# Patient Record
Sex: Male | Born: 1940 | Race: White | Hispanic: No | Marital: Married | Smoking: Never smoker
Health system: Southern US, Community
[De-identification: ages and names within clinical notes are randomized; demographics above are authoritative.]

## PROBLEM LIST (undated history)

## (undated) DIAGNOSIS — E7800 Pure hypercholesterolemia, unspecified: Secondary | ICD-10-CM

## (undated) DIAGNOSIS — R079 Chest pain, unspecified: Secondary | ICD-10-CM

## (undated) DIAGNOSIS — D126 Benign neoplasm of colon, unspecified: Secondary | ICD-10-CM

## (undated) DIAGNOSIS — J984 Other disorders of lung: Secondary | ICD-10-CM

## (undated) DIAGNOSIS — J45909 Unspecified asthma, uncomplicated: Secondary | ICD-10-CM

## (undated) DIAGNOSIS — K5730 Diverticulosis of large intestine without perforation or abscess without bleeding: Secondary | ICD-10-CM

## (undated) DIAGNOSIS — C61 Malignant neoplasm of prostate: Secondary | ICD-10-CM

## (undated) DIAGNOSIS — F411 Generalized anxiety disorder: Secondary | ICD-10-CM

## (undated) DIAGNOSIS — J329 Chronic sinusitis, unspecified: Secondary | ICD-10-CM

## (undated) DIAGNOSIS — M545 Low back pain, unspecified: Secondary | ICD-10-CM

## (undated) DIAGNOSIS — K449 Diaphragmatic hernia without obstruction or gangrene: Secondary | ICD-10-CM

## (undated) DIAGNOSIS — I059 Rheumatic mitral valve disease, unspecified: Secondary | ICD-10-CM

## (undated) DIAGNOSIS — IMO0002 Reserved for concepts with insufficient information to code with codable children: Secondary | ICD-10-CM

## (undated) DIAGNOSIS — M25519 Pain in unspecified shoulder: Secondary | ICD-10-CM

## (undated) DIAGNOSIS — I10 Essential (primary) hypertension: Secondary | ICD-10-CM

## (undated) DIAGNOSIS — M542 Cervicalgia: Secondary | ICD-10-CM

## (undated) DIAGNOSIS — Z8719 Personal history of other diseases of the digestive system: Secondary | ICD-10-CM

## (undated) DIAGNOSIS — J309 Allergic rhinitis, unspecified: Secondary | ICD-10-CM

## (undated) DIAGNOSIS — J209 Acute bronchitis, unspecified: Secondary | ICD-10-CM

## (undated) DIAGNOSIS — K219 Gastro-esophageal reflux disease without esophagitis: Secondary | ICD-10-CM

## (undated) HISTORY — DX: Chest pain, unspecified: R07.9

## (undated) HISTORY — DX: Reserved for concepts with insufficient information to code with codable children: IMO0002

## (undated) HISTORY — DX: Other disorders of lung: J98.4

## (undated) HISTORY — DX: Chronic sinusitis, unspecified: J32.9

## (undated) HISTORY — DX: Unspecified asthma, uncomplicated: J45.909

## (undated) HISTORY — DX: Diaphragmatic hernia without obstruction or gangrene: K44.9

## (undated) HISTORY — DX: Pure hypercholesterolemia, unspecified: E78.00

## (undated) HISTORY — DX: Gastro-esophageal reflux disease without esophagitis: K21.9

## (undated) HISTORY — DX: Cervicalgia: M54.2

## (undated) HISTORY — DX: Low back pain: M54.5

## (undated) HISTORY — DX: Acute bronchitis, unspecified: J20.9

## (undated) HISTORY — DX: Allergic rhinitis, unspecified: J30.9

## (undated) HISTORY — DX: Generalized anxiety disorder: F41.1

## (undated) HISTORY — PX: ACHILLES TENDON REPAIR: SUR1153

## (undated) HISTORY — DX: Benign neoplasm of colon, unspecified: D12.6

## (undated) HISTORY — DX: Diverticulosis of large intestine without perforation or abscess without bleeding: K57.30

## (undated) HISTORY — DX: Pain in unspecified shoulder: M25.519

## (undated) HISTORY — DX: Personal history of other diseases of the digestive system: Z87.19

## (undated) HISTORY — PX: LUMBAR LAMINECTOMY: SHX95

## (undated) HISTORY — DX: Rheumatic mitral valve disease, unspecified: I05.9

## (undated) HISTORY — PX: TONSILLECTOMY: SUR1361

## (undated) HISTORY — DX: Malignant neoplasm of prostate: C61

## (undated) HISTORY — PX: OTHER SURGICAL HISTORY: SHX169

## (undated) HISTORY — DX: Low back pain, unspecified: M54.50

---

## 2001-10-18 ENCOUNTER — Encounter: Admission: RE | Admit: 2001-10-18 | Discharge: 2001-10-18 | Payer: Self-pay | Admitting: Otolaryngology

## 2001-10-18 ENCOUNTER — Encounter: Payer: Self-pay | Admitting: Otolaryngology

## 2004-08-05 ENCOUNTER — Ambulatory Visit: Payer: Self-pay | Admitting: Pulmonary Disease

## 2004-09-29 ENCOUNTER — Ambulatory Visit: Payer: Self-pay | Admitting: Pulmonary Disease

## 2005-10-15 ENCOUNTER — Ambulatory Visit: Payer: Self-pay | Admitting: Pulmonary Disease

## 2005-10-30 ENCOUNTER — Ambulatory Visit: Payer: Self-pay | Admitting: Gastroenterology

## 2005-11-09 ENCOUNTER — Encounter (INDEPENDENT_AMBULATORY_CARE_PROVIDER_SITE_OTHER): Payer: Self-pay | Admitting: Specialist

## 2005-11-09 ENCOUNTER — Ambulatory Visit: Payer: Self-pay | Admitting: Gastroenterology

## 2005-11-12 ENCOUNTER — Ambulatory Visit: Payer: Self-pay | Admitting: Pulmonary Disease

## 2005-12-22 ENCOUNTER — Ambulatory Visit: Payer: Self-pay | Admitting: Gastroenterology

## 2005-12-22 ENCOUNTER — Ambulatory Visit: Payer: Self-pay | Admitting: Pulmonary Disease

## 2006-08-04 ENCOUNTER — Ambulatory Visit: Payer: Self-pay | Admitting: Pulmonary Disease

## 2006-08-04 LAB — CONVERTED CEMR LAB
ALT: 17 units/L (ref 0–40)
AST: 21 units/L (ref 0–37)
Albumin: 3.8 g/dL (ref 3.5–5.2)
Alkaline Phosphatase: 73 units/L (ref 39–117)
BUN: 14 mg/dL (ref 6–23)
Basophils Absolute: 0 10*3/uL (ref 0.0–0.1)
Basophils Relative: 0.4 % (ref 0.0–1.0)
Bilirubin Urine: NEGATIVE
CO2: 29 meq/L (ref 19–32)
Calcium: 9.1 mg/dL (ref 8.4–10.5)
Chloride: 108 meq/L (ref 96–112)
Chol/HDL Ratio, serum: 7.1
Cholesterol: 232 mg/dL (ref 0–200)
Creatinine, Ser: 1 mg/dL (ref 0.4–1.5)
Crystals: NEGATIVE
Eosinophil percent: 3 % (ref 0.0–5.0)
GFR calc non Af Amer: 80 mL/min
Glomerular Filtration Rate, Af Am: 97 mL/min/{1.73_m2}
Glucose, Bld: 105 mg/dL — ABNORMAL HIGH (ref 70–99)
HCT: 42.4 % (ref 39.0–52.0)
HDL: 32.6 mg/dL — ABNORMAL LOW (ref >39.0)
Hemoglobin: 13.8 g/dL (ref 13.0–17.0)
Ketones, ur: NEGATIVE mg/dL
LDL DIRECT: 164.5 mg/dL
Leukocytes, UA: NEGATIVE
Lymphocytes Relative: 24 % (ref 12.0–46.0)
MCHC: 32.5 g/dL (ref 30.0–36.0)
MCV: 90.1 fL (ref 78.0–100.0)
Monocytes Absolute: 0.6 10*3/uL (ref 0.2–0.7)
Monocytes Relative: 9.6 % (ref 3.0–11.0)
Mucus, UA: NEGATIVE
Neutro Abs: 3.9 10*3/uL (ref 1.4–7.7)
Neutrophils Relative %: 63 % (ref 43.0–77.0)
Nitrite: NEGATIVE
PSA: 4.87 ng/mL — ABNORMAL HIGH (ref 0.10–4.00)
Platelets: 201 10*3/uL (ref 150–400)
Potassium: 4.1 meq/L (ref 3.5–5.1)
RBC: 4.71 M/uL (ref 4.22–5.81)
RDW: 13.5 % (ref 11.5–14.6)
Sodium: 141 meq/L (ref 135–145)
Specific Gravity, Urine: >=1.03 (ref 1.000–1.03)
TSH: 1.08 microintl units/mL (ref 0.35–5.50)
Total Bilirubin: 1.2 mg/dL (ref 0.3–1.2)
Total Protein, Urine: NEGATIVE mg/dL
Total Protein: 6.6 g/dL (ref 6.0–8.3)
Triglyceride fasting, serum: 102 mg/dL (ref 0–149)
Urine Glucose: NEGATIVE mg/dL
Urobilinogen, UA: 0.2 (ref 0.0–1.0)
VLDL: 20 mg/dL (ref 0–40)
WBC: 6.2 10*3/uL (ref 4.5–10.5)
pH: 6 (ref 5.0–8.0)

## 2006-11-10 ENCOUNTER — Inpatient Hospital Stay (HOSPITAL_COMMUNITY): Admission: RE | Admit: 2006-11-10 | Discharge: 2006-11-11 | Payer: Self-pay | Admitting: Urology

## 2006-11-10 ENCOUNTER — Encounter (INDEPENDENT_AMBULATORY_CARE_PROVIDER_SITE_OTHER): Payer: Self-pay | Admitting: Specialist

## 2007-02-22 ENCOUNTER — Ambulatory Visit: Payer: Self-pay | Admitting: Pulmonary Disease

## 2007-02-22 LAB — CONVERTED CEMR LAB
ALT: 14 units/L (ref 0–53)
AST: 15 units/L (ref 0–37)
Bilirubin, Direct: 0.1 mg/dL (ref 0.0–0.3)
Cholesterol: 193 mg/dL (ref 0–200)
HDL: 30 mg/dL — ABNORMAL LOW (ref >39.0)
Total Bilirubin: 0.9 mg/dL (ref 0.3–1.2)
Triglycerides: 73 mg/dL (ref 0–149)

## 2007-10-07 ENCOUNTER — Encounter: Payer: Self-pay | Admitting: Pulmonary Disease

## 2008-02-17 ENCOUNTER — Encounter: Payer: Self-pay | Admitting: Pulmonary Disease

## 2008-02-29 ENCOUNTER — Ambulatory Visit: Admission: RE | Admit: 2008-02-29 | Discharge: 2008-04-17 | Payer: Self-pay | Admitting: Radiation Oncology

## 2008-03-01 ENCOUNTER — Encounter: Payer: Self-pay | Admitting: Pulmonary Disease

## 2008-05-01 ENCOUNTER — Encounter: Payer: Self-pay | Admitting: Pulmonary Disease

## 2008-05-18 ENCOUNTER — Encounter: Payer: Self-pay | Admitting: Pulmonary Disease

## 2008-05-28 DIAGNOSIS — K219 Gastro-esophageal reflux disease without esophagitis: Secondary | ICD-10-CM | POA: Insufficient documentation

## 2008-05-28 DIAGNOSIS — J309 Allergic rhinitis, unspecified: Secondary | ICD-10-CM | POA: Insufficient documentation

## 2008-05-28 DIAGNOSIS — E7800 Pure hypercholesterolemia, unspecified: Secondary | ICD-10-CM

## 2008-06-05 ENCOUNTER — Telehealth: Payer: Self-pay | Admitting: Gastroenterology

## 2008-06-11 DIAGNOSIS — K5730 Diverticulosis of large intestine without perforation or abscess without bleeding: Secondary | ICD-10-CM | POA: Insufficient documentation

## 2008-06-11 DIAGNOSIS — K449 Diaphragmatic hernia without obstruction or gangrene: Secondary | ICD-10-CM | POA: Insufficient documentation

## 2008-06-11 DIAGNOSIS — F411 Generalized anxiety disorder: Secondary | ICD-10-CM | POA: Insufficient documentation

## 2008-06-11 DIAGNOSIS — J45909 Unspecified asthma, uncomplicated: Secondary | ICD-10-CM | POA: Insufficient documentation

## 2008-06-12 ENCOUNTER — Ambulatory Visit: Payer: Self-pay | Admitting: Gastroenterology

## 2008-06-13 ENCOUNTER — Encounter: Payer: Self-pay | Admitting: Gastroenterology

## 2008-06-18 ENCOUNTER — Ambulatory Visit: Payer: Self-pay | Admitting: Internal Medicine

## 2008-08-24 ENCOUNTER — Encounter: Payer: Self-pay | Admitting: Pulmonary Disease

## 2008-09-29 ENCOUNTER — Emergency Department (HOSPITAL_COMMUNITY): Admission: EM | Admit: 2008-09-29 | Discharge: 2008-09-30 | Payer: Self-pay | Admitting: Emergency Medicine

## 2008-10-01 ENCOUNTER — Telehealth: Payer: Self-pay | Admitting: Pulmonary Disease

## 2008-10-04 ENCOUNTER — Encounter (INDEPENDENT_AMBULATORY_CARE_PROVIDER_SITE_OTHER): Payer: Self-pay | Admitting: *Deleted

## 2008-10-04 ENCOUNTER — Ambulatory Visit: Payer: Self-pay | Admitting: Pulmonary Disease

## 2008-10-04 DIAGNOSIS — J984 Other disorders of lung: Secondary | ICD-10-CM | POA: Insufficient documentation

## 2008-10-04 DIAGNOSIS — C61 Malignant neoplasm of prostate: Secondary | ICD-10-CM

## 2008-10-04 DIAGNOSIS — M545 Low back pain: Secondary | ICD-10-CM

## 2008-10-04 DIAGNOSIS — I059 Rheumatic mitral valve disease, unspecified: Secondary | ICD-10-CM | POA: Insufficient documentation

## 2008-10-04 DIAGNOSIS — J209 Acute bronchitis, unspecified: Secondary | ICD-10-CM

## 2008-10-04 DIAGNOSIS — M542 Cervicalgia: Secondary | ICD-10-CM | POA: Insufficient documentation

## 2008-10-04 DIAGNOSIS — IMO0002 Reserved for concepts with insufficient information to code with codable children: Secondary | ICD-10-CM | POA: Insufficient documentation

## 2008-10-04 DIAGNOSIS — J329 Chronic sinusitis, unspecified: Secondary | ICD-10-CM | POA: Insufficient documentation

## 2008-10-04 DIAGNOSIS — M25519 Pain in unspecified shoulder: Secondary | ICD-10-CM | POA: Insufficient documentation

## 2008-10-12 ENCOUNTER — Encounter: Payer: Self-pay | Admitting: Pulmonary Disease

## 2008-10-17 ENCOUNTER — Encounter: Payer: Self-pay | Admitting: Pulmonary Disease

## 2008-11-01 ENCOUNTER — Ambulatory Visit: Payer: Self-pay | Admitting: Gastroenterology

## 2008-11-01 DIAGNOSIS — Z860100 Personal history of colon polyps, unspecified: Secondary | ICD-10-CM | POA: Insufficient documentation

## 2008-11-19 ENCOUNTER — Ambulatory Visit: Payer: Self-pay | Admitting: Gastroenterology

## 2008-11-19 ENCOUNTER — Encounter: Payer: Self-pay | Admitting: Gastroenterology

## 2008-11-22 ENCOUNTER — Encounter: Payer: Self-pay | Admitting: Gastroenterology

## 2008-12-25 ENCOUNTER — Encounter: Payer: Self-pay | Admitting: Pulmonary Disease

## 2009-04-02 ENCOUNTER — Encounter: Payer: Self-pay | Admitting: Pulmonary Disease

## 2009-06-25 ENCOUNTER — Encounter: Payer: Self-pay | Admitting: Pulmonary Disease

## 2009-08-01 ENCOUNTER — Encounter: Payer: Self-pay | Admitting: Pulmonary Disease

## 2009-10-17 ENCOUNTER — Ambulatory Visit: Payer: Self-pay | Admitting: Pulmonary Disease

## 2009-10-18 LAB — CONVERTED CEMR LAB
Alkaline Phosphatase: 84 units/L (ref 39–117)
Basophils Absolute: 0.2 10*3/uL — ABNORMAL HIGH (ref 0.0–0.1)
Basophils Relative: 2.4 % (ref 0.0–3.0)
Bilirubin, Direct: 0.1 mg/dL (ref 0.0–0.3)
CO2: 29 meq/L (ref 19–32)
Calcium: 9.1 mg/dL (ref 8.4–10.5)
Creatinine, Ser: 1 mg/dL (ref 0.4–1.5)
Eosinophils Absolute: 0.3 10*3/uL (ref 0.0–0.7)
GFR calc non Af Amer: 78.98 mL/min (ref >60)
Hemoglobin: 13.9 g/dL (ref 13.0–17.0)
Lymphocytes Relative: 15.6 % (ref 12.0–46.0)
MCHC: 33.6 g/dL (ref 30.0–36.0)
Monocytes Relative: 5.8 % (ref 3.0–12.0)
Neutro Abs: 6.3 10*3/uL (ref 1.4–7.7)
Neutrophils Relative %: 73 % (ref 43.0–77.0)
RBC: 4.45 M/uL (ref 4.22–5.81)
RDW: 13.4 % (ref 11.5–14.6)
Sodium: 142 meq/L (ref 135–145)
Total Bilirubin: 0.9 mg/dL (ref 0.3–1.2)

## 2009-11-29 ENCOUNTER — Encounter: Payer: Self-pay | Admitting: Pulmonary Disease

## 2010-04-01 ENCOUNTER — Encounter: Payer: Self-pay | Admitting: Pulmonary Disease

## 2010-04-17 ENCOUNTER — Ambulatory Visit: Payer: Self-pay | Admitting: Pulmonary Disease

## 2010-04-19 LAB — CONVERTED CEMR LAB
ALT: 20 units/L (ref 0–53)
AST: 18 units/L (ref 0–37)
Albumin: 4.1 g/dL (ref 3.5–5.2)
BUN: 21 mg/dL (ref 6–23)
Chloride: 106 meq/L (ref 96–112)
Cholesterol: 235 mg/dL — ABNORMAL HIGH (ref 0–200)
Creatinine, Ser: 1 mg/dL (ref 0.4–1.5)
Eosinophils Relative: 5.8 % — ABNORMAL HIGH (ref 0.0–5.0)
Glucose, Bld: 96 mg/dL (ref 70–99)
HDL: 31.9 mg/dL — ABNORMAL LOW (ref >39.00)
Lymphocytes Relative: 19.5 % (ref 12.0–46.0)
Monocytes Absolute: 0.5 10*3/uL (ref 0.1–1.0)
Monocytes Relative: 10.8 % (ref 3.0–12.0)
Neutrophils Relative %: 63.7 % (ref 43.0–77.0)
Platelets: 186 10*3/uL (ref 150.0–400.0)
Potassium: 4.5 meq/L (ref 3.5–5.1)
RBC: 4.62 M/uL (ref 4.22–5.81)
Total CHOL/HDL Ratio: 7
Total Protein: 6.8 g/dL (ref 6.0–8.3)
Triglycerides: 153 mg/dL — ABNORMAL HIGH (ref 0.0–149.0)
WBC: 4.9 10*3/uL (ref 4.5–10.5)

## 2010-05-07 ENCOUNTER — Telehealth (INDEPENDENT_AMBULATORY_CARE_PROVIDER_SITE_OTHER): Payer: Self-pay | Admitting: *Deleted

## 2010-07-21 ENCOUNTER — Telehealth (INDEPENDENT_AMBULATORY_CARE_PROVIDER_SITE_OTHER): Payer: Self-pay | Admitting: *Deleted

## 2010-07-24 ENCOUNTER — Telehealth (INDEPENDENT_AMBULATORY_CARE_PROVIDER_SITE_OTHER): Payer: Self-pay | Admitting: *Deleted

## 2010-08-06 ENCOUNTER — Telehealth: Payer: Self-pay | Admitting: Pulmonary Disease

## 2010-08-12 ENCOUNTER — Encounter: Payer: Self-pay | Admitting: Pulmonary Disease

## 2010-09-02 NOTE — Letter (Signed)
[facility]   Imported By: Sherian Rein 12/06/2009 09:29:39  _____________________________________________________________________  External Attachment:    Type:   Image     Comment:   External Document

## 2010-09-02 NOTE — Assessment & Plan Note (Signed)
Summary: cpx/fasting/apc   Primary Care Provider:  Alroy Dust, MD  CC:  18 month ROV & review of mult medical problems....  History of Present Illness: 70 y/o WM here for a follow up visit... he has multiple medical problems as noted below...     ~  states he was doing fine until Dec09 w/ onset of left shoulder pain- treated self w/ Advil/ Tylenol/ Icey Hot & improved after 2-3 weeks... recurrence of pain towards the end of Feb10 & went to an EMT friend who did EKG "for precautionary reasons" & it was OK, but BP was 168/102 (BP's at home usually 130-140's/ 70-80's)... he went to Urgent Care "to be safe & get blood work" they did XRay of chest & shoulder w/ ?of widening of mediastinum- sent to ER where BP was 170/110 initially and grad ret to 130/80 range... eval included CTChest/ Abd/ Pelvis= NEG w/ mult incidental findings- right base atelec, 6mm RLL nodule, right adrenal nodule, cysts in kidneys/ liver, DDD in lumbar sp... XRay CSpine= degen changes, bilat foraminal stenoses, facet arthropathy, anterolisthesis C5 & C6... EKG was neg x PVC, and cardiac enz neg... labs were normal... he was told to f/u w/ [place] for "mass in lung" and DrBerry for "further cardiac testing"- no meds given... he saw DrGamble 3/2 w/ labs done & set up for Myoview...   ~  Mar10:  we discussed the above frantic eval & focused on 3 things: 1)  Neck & shoulder pain- we will start anti-inflamm pain meds, heat, etc; and refer to Neurosurg; 2)  Cardiac eval- already arranged by DrGamble & Amlodipine 5mg  started... pt will be reassured by these tests; 3)  6mm RLL lung nodule- pt is a non-smoker, hx of asthma, no chest symptoms- f/u CXR planned...   ~  April 17, 2010:  88mo ROV- he saw DrBerry 3/10 w/ 2DEcho (normal, x mild AI) & Myoview (neg- no ischemia, EF= 63%)... he had a colonoscopy by DrPatterson 4/10- mild divertics, sm nodule in sigmid, bx= tubular adenoma & f/u planned 39yrs...  he saw DrGrapey for Urology 8/11 for f/u  prostate cancer s/p prostatectomy & then had adjuvant XRT for rising PSA post surg & is followed every 4-74mo w/ continued slow rise in PSA to 0.8 8/11(watchful waiting protocol before considering hormonal therapy)...  he feels his Asthma is under good control;  CXR today for f/u 6mm nodule-  no change;  BP undr good conrol, denies CP, etc;  Chol is poorly controlled on diet alone- but he is intol to all statins etc;  otherw stable w/o new complaints or concerns he says.   Current Problems:   ALLERGIC RHINITIS (ICD-477.9) & Hx of SINUSITIS, RECURRENT (ICD-473.9) - he uses OTC antihistamines Prn... he has a long hx of allergies w/ pos testing to dust mostly... he had nasal polyp surg in 1985... he is ASPIRIN sensitive ([facility])... he had additional sinus surgery by DrLawrence in 1997...  ~  9/11:  we discussed chr sinus regimen w/ Antihist in AM, Saline nasal pray, & FLONASE Qhs.  ASTHMA (ICD-493.90) & Hx of ASTHMATIC BRONCHITIS, ACUTE (ICD-466.0) - on ADVAIR 500Bid & VENTOLIN Prn w/ good control... prev on Singulair as well but he stopped this on his own.  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  repeat CXR 9/11 showed clear lungs, norm heart size, no nodule seen...  Hx of CHEST PAIN (  ICD-786.50) & MITRAL VALVE PROLAPSE (ICD-424.0) - there is a pos family hx of coronary disease... he had a negative cath in 1992- no coronary disease, min MVP seen... he has a hx of MVP and occas ectopy w/ incr BP response to exercise... given low dose Atenolol in past... 2DEcho and Cardiolite in 2005 were both reported normal by DrBerry...  ~  3/10:  repeat cardiac eval by [facility] w/ 2DEcho norm x mild AI, & neg Myoview- no ischemia, EF=63%... started AMLODIPINE 5mg /d.  HYPERCHOLESTEROLEMIA (ICD-272.0) - eval and management by DrBerry for [facility]... pt reports being tried on numerous Statins and Zetia & states INTOL to all these  meds... in 2008 he stopped Zocor40 due to aching and started Grapeseed extract made by his cousin in [facility] (he reports that this caused bone pain as well)...  ~  FLP here 7/08 showed TChol 193, TG 73, HDL 30, LDL 148... rec- incr Simva20 to 40mg /d (Intol & stopped by pt).  ~  He reports interim FLPs by [facility]...  ~  FLP here 9/11 showed TChol 235, TG 153, HDL 32, LDL 184... needs better diet & exercise program.  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  ~  he had a 24H pH probe in 1991 which was unremarkable...  ~  EGD 4/07 by DrPatterson showed 8cmHH, cameron erosions, chr GERD, & gastritis... HPylori was neg.  DIVERTICULOSIS, COLON (ICD-562.10) & COLONIC POLYPS (ICD-211.3) - he had a lower GI Bleed & was hosp at [facility] in [place] 2/07- required 6u blood, did not require surgery... he takes METAMUCIL daily...  ~  colonoscopy 4/07 by DrPatterson showed divertics, 6mm polyp in asc colon= adenomatous w/ f/u planned for 50yrs...  ~  CT Abd/ Pelvis Feb10= NEG w/ mult incidental findings- right base atelec, 6mm RLL nodule, right adrenal nodule, cysts in kidneys/ liver, DDD in lumbar sp...  ~  colonoscopy 4/10 by DrPatterson showed divertics & nodule in sigmoid= tubular adenoma on bx; f/u planned 73yrs.  Hx of PROSTATE CANCER (ICD-185) - found to have right postate nodule on DRE 1/08 during his last CPX... PSA= 4.87... referred to Urology w/ pos biopies and robotic prostatectomy 4/08 by DrGrapey- he had capsular extension but neg margins etc... subseq PSA's were nondetectable, but then started to rise again and was 0.22 in Jul09- therefore sent for adjuvant XRT which he tolerated well w/ decr in the PSA... pt still followed by DrGrapey every 4-6 months due to continued slow rise in PSA post XRT...  DEGENERATIVE DISC DISEASE (ICD-722.6) - on VOLTAREN 75mg Bid Prn (he apparently tolerates NSAIDs OK despite his hx [facility] Ast[facility] & ASA sensitivity)...  NECK PAIN  (ICD-723.1) & SHOULDER PAIN (ICD-719.41)  ~  XRays Feb10 from ER showed  degen changes, bilat foraminal stenoses, facet arthropathy, anterolisthesis C5 & C6.  Hx of BACK PAIN, LUMBAR (ICD-724.2) - he had 2 lumbar laminectomies in the past by DrDeaton...   ANXIETY (ICD-300.00)   Preventive Screening-Counseling & Management  Alcohol-Tobacco     Smoking Status: never  Allergies: 1)  Asa  Comments:  Nurse/Medical Assistant: The patient's medications and allergies were reviewed with the patient and were updated in the Medication and Allergy Lists.  Past History:  Past Medical History: ALLERGIC RHINITIS (ICD-477.9) Hx of SINUSITIS, RECURRENT (ICD-473.9) ASTHMA (ICD-493.90) Hx of ASTHMATIC BRONCHITIS, ACUTE (ICD-466.0) PULMONARY NODULE (ICD-518.89) Hx of CHEST PAIN (ICD-786.50) MITRAL VALVE PROLAPSE (ICD-424.0) HYPERCHOLESTEROLEMIA (ICD-272.0) HIATAL HERNIA (ICD-553.3) GERD (ICD-530.81) DIVERTICULOSIS, COLON (ICD-562.10) COLONIC POLYPS (ICD-211.3) HX OF  GALLSTONE (ICD-V12.79) Hx of PROSTATE CANCER (ICD-185) DEGENERATIVE DISC DISEASE (ICD-722.6) NECK PAIN (ICD-723.1) SHOULDER PAIN (ICD-719.41) Hx of BACK PAIN, LUMBAR (ICD-724.2) ANXIETY (ICD-300.00)  Past Surgical History: S/P tonsillectomy S/P 2 lumbar laminectomies by DrDeaton S/P 3 sinus operations for polyps & sinusitis by DrLawrence S/P achilles tendon repair S/P robotic prostatectomy by DrGrapey  Family History: Reviewed history from 10/04/2008 and no changes required. Father died age 38 w/ MI Mother died age 31 w/ lung cancer 5 Siblings- 3 Bro- one died w/ MI at age 53, one died age 39 w/ heart disease 2 Sis- one w/ thyroid problems & heart disease  Social History: Reviewed history from 10/17/2009 and no changes required. 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    Review of Systems       The patient complains of nasal congestion,  constipation, gas/bloating, stiffness, arthritis, and hay fever.  The patient denies fever, chills, sweats, anorexia, fatigue, weakness, malaise, weight loss, sleep disorder, blurring, diplopia, eye irritation, eye discharge, vision loss, eye pain, photophobia, earache, ear discharge, tinnitus, decreased hearing, nosebleeds, sore throat, hoarseness, chest pain, palpitations, syncope, dyspnea on exertion, orthopnea, PND, peripheral edema, cough, dyspnea at rest, excessive sputum, hemoptysis, wheezing, pleurisy, nausea, vomiting, diarrhea, change in bowel habits, abdominal pain, melena, hematochezia, jaundice, indigestion/heartburn, dysphagia, odynophagia, dysuria, hematuria, urinary frequency, urinary hesitancy, nocturia, incontinence, back pain, joint pain, joint swelling, muscle cramps, muscle weakness, sciatica, restless legs, leg pain at night, leg pain with exertion, rash, itching, dryness, suspicious lesions, paralysis, paresthesias, seizures, tremors, vertigo, transient blindness, frequent falls, frequent headaches, difficulty walking, depression, anxiety, memory loss, confusion, cold intolerance, heat intolerance, polydipsia, polyphagia, polyuria, unusual weight change, abnormal bruising, bleeding, enlarged lymph nodes, urticaria, allergic rash, and recurrent infections.    Vital Signs:  Patient profile:   70 year old male Height:      70 inches Weight:      197.25 pounds O2 Sat:      96 % on Room air Temp:     97.4 degrees F oral Pulse rate:   42 / minute BP sitting:   130 / 68  (left arm) Cuff size:   large  Vitals Entered By: Randell Loop CMA (April 17, 2010 9:07 AM)  O2 Sat at Rest %:  96 O2 Flow:  Room air CC: 18 month ROV & review of mult medical problems... Is Patient Diabetic? No Pain Assessment Patient in pain? no      Comments meds updated today with pt   Physical Exam  Additional Exam:  WD, WN, 70 y/o WM in NAD...  GENERAL:  Alert & oriented; pleasant &  cooperative... HEENT:  /AT, EOM-wnl, PERRLA, EACs-clear, TMs-wnl, NOSE-congested, THROAT-clear & wnl. NECK:  Supple w/ decrROM; no JVD; normal carotid impulses w/o bruits; no thyromegaly or nodules palpated; no lymphadenopathy. CHEST:  Clear to P & A;  min scat rhonchi without wheezing/ rales/ consolidation... HEART:  Regular Rhythm; without murmurs/ rubs/ or gallops detected... ABDOMEN:  Soft & nontender; normal bowel sounds; no organomegaly or masses palpated... EXT: without deformities, mod arthritic changes; no varicose veins/ venous insuffic/ or edema. BACK:  +tender spots/ trigger points... decr ROM spine... NEURO:  CN's intact;  no focal neuro deficits... DERM:  No lesions noted; no rash etc...    CXR  Procedure date:  04/17/2010  Findings:      CHEST - 2 VIEW Comparison: 08/04/2006   Findings: Heart size is within normal limits.  Both lungs are clear.  No evidence of pleural effusion.  No mass or lymphadenopathy identified.   IMPRESSION: Stable exam.  No active disease.   Read By:  Danae Orleans,  M.D.   MISC. Report  Procedure date:  04/17/2010  Findings:      BMP (METABOL)   Sodium                    141 mEq/L                   135-145   Potassium                 4.5 mEq/L                   3.5-5.1   Chloride                  106 mEq/L                   96-112   Carbon Dioxide            29 mEq/L                    19-32   Glucose                   96 mg/dL                    16-10   BUN                       21 mg/dL                    9-60   Creatinine                1.0 mg/dL                   4.5-4.0   Calcium                   9.1 mg/dL                   9.8-11.9   GFR                       77.96 mL/min                >60  Hepatic/Liver Function Panel (HEPATIC)   Total Bilirubin           1.0 mg/dL                   1.4-7.8   Direct Bilirubin          0.2 mg/dL                   2.9-5.6   Alkaline Phosphatase      75 U/L                      39-117    AST                       18 U/L                      0-37   ALT  20 U/L                      0-53   Total Protein             6.8 g/dL                    1.6-1.0   Albumin                   4.1 g/dL                    9.6-0.4  CBC Platelet w/Diff (CBCD)   White Cell Count          4.9 K/uL                    4.5-10.5   Red Cell Count            4.62 Mil/uL                 4.22-5.81   Hemoglobin                15.1 g/dL                   54.0-98.1   Hematocrit                43.7 %                      39.0-52.0   MCV                       94.6 fl                     78.0-100.0   Platelet Count            186.0 K/uL                  150.0-400.0   Neutrophil %              63.7 %                      43.0-77.0   Lymphocyte %              19.5 %                      12.0-46.0   Monocyte %                10.8 %                      3.0-12.0   Eosinophils%         [H]  5.8 %                       0.0-5.0   Basophils %               0.2 %                       0.0-3.0  Comments:      Lipid Panel (LIPID)   Cholesterol          [H]  235 mg/dL                   1-914   Triglycerides        [  H]  153.0 mg/dL                 6.5-784.6   HDL                  [L]  96.29 mg/dL                 >52.84   Cholesterol LDL - Direct                             184.0 mg/dL       TSH (TSH)   FastTSH                   1.55 uIU/mL                 0.35-5.50  Vitamin D (25-Hydroxy) (13244)  Vitamin D (25-Hydroxy)                             32 ng/mL                    30-89  Impression & Recommendations:  Problem # 1:  ASTHMA (ICD-493.90) He has AR/ Asthma>  continue regular meds & we discussed Zyrtek, Saline, Flonase for nose;  and Advair, Ventolin for Asthma... His updated medication list for this problem includes:    Advair Diskus 500-50 Mcg/dose Misc (Fluticasone-salmeterol) ..... One puff by mouth two times a day   rinse mouth after each use    Ventolin Hfa 108 (90 Base)  Mcg/act Aers (Albuterol sulfate) ..... Inhale 2 puffs every four hours as needed  Problem # 2:  PULMONARY NODULE (ICD-518.89) CXR is clear>  no nodule seen... Orders: T-2 View CXR (71020TC)  Problem # 3:  HYPERCHOLESTEROLEMIA (ICD-272.0) Chol is not well controlled on diet, but no options for med Rx as he is intol to all meds in this category... he is not inclined to try the [facility] Clinic therefore continue diet & exercise efforts at better control... Orders: TLB-BMP (Basic Metabolic Panel-BMET) (80048-METABOL) TLB-Hepatic/Liver Function Pnl (80076-HEPATIC) TLB-CBC Platelet - w/Differential (85025-CBCD) TLB-Lipid Panel (80061-LIPID) TLB-TSH (Thyroid Stimulating Hormone) (84443-TSH) T-Vitamin D (25-Hydroxy) (01027-25366)  Problem # 4:  GERD (ICD-530.81) Stable>  continue PPI therapy. His updated medication list for this problem includes:    Omeprazole 40 Mg Cpdr (Omeprazole) .Marland Kitchen... Take 1 tab by mouth two times a day w/ the diclofenac.  Problem # 5:  DIVERTICULOSIS, COLON (ICD-562.10) S/p colon 4/10 w/ tubular adenoma... f/u planned 72yrs.  Problem # 6:  Hx of PROSTATE CANCER (ICD-185) Followed by DrGrapey every 4-90mo...  Problem # 7:  DEGENERATIVE DISC DISEASE (ICD-722.6) He has DJD, neck pain, back pain, etc...  Problem # 8:  OTHER MEEICAL PROBLEMS AS NOTED>>> He refuses flu shot...  Complete Medication List: 1)  Advair Diskus 500-50 Mcg/dose Misc (Fluticasone-salmeterol) .... One puff by mouth two times a day   rinse mouth after each use 2)  Ventolin Hfa 108 (90 Base) Mcg/act Aers (Albuterol sulfate) .... Inhale 2 puffs every four hours as needed 3)  Amlodipine Besylate 5 Mg Tabs (Amlodipine besylate) .... Take 1 tab by mouth once daily.Marland KitchenMarland Kitchen 4)  Omeprazole 40 Mg Cpdr (Omeprazole) .... Take 1 tab by mouth two times a day w/ the diclofenac. 5)  Metamucil 48.57 % Powd (Psyllium) .... Take 1 tablesp in water daily.Marland KitchenMarland Kitchen 6)  Diclofenac Sodium 75 Mg Tbec (Diclofenac sodium) .Marland KitchenMarland KitchenMarland Kitchen  Take 1  tab by mouth two times a day w/ food as needed for arthritis pain. 7)  Allegra Allergy 180 Mg Tabs (Fexofenadine hcl) .... Take 1 tab by mouth once daily.Marland KitchenMarland Kitchen 8)  Fluticasone Propionate 50 Mcg/act Susp (Fluticasone propionate) .... 2 sprays in each nostril at bedtime...  Other Orders: Flu Vaccine 53yrs + MEDICARE PATIENTS (Z6109) Administration Flu vaccine - MCR (U0454) Pneumococcal Vaccine (09811) Admin 1st Vaccine (91478)  Patient Instructions: 1)  Today we updated your med list- see below.... 2)  For your nasal allergies:  use an OTC antihistamine like Claritin/ Zyrtek/ Allegra every morning;  spray your nose w/ a SALINE nasal spray every few hours to keep it clear & open;  and spray the FLONASE 2 sp in each nostril at bedtime.Marland KitchenMarland Kitchen 3)  Today we did your follow up CXR & FASTING blood work... please call the "phone tree" in a few days for your lab results.Marland KitchenMarland Kitchen 4)  Call for any problems.Marland KitchenMarland Kitchen 5)  Please schedule a follow-up appointment in 1 year, sooner as needed. Prescriptions: FLUTICASONE PROPIONATE 50 MCG/ACT SUSP (FLUTICASONE PROPIONATE) 2 sprays in each nostril at bedtime...  #1 x 12   Entered and Authorized by:   Michele Mcalpine MD   Signed by:   Michele Mcalpine MD on 04/17/2010   Method used:   Print then Give to Patient   RxID:   [id number]    Immunizations Administered:  Pneumonia Vaccine:    Vaccine Type: Pneumovax    Site: right deltoid    Mfr: Merck    Dose: 0.5 ml    Route: IM    Given by: Randell Loop CMA    Exp. Date: 10/16/2011    Lot #: [id number]    VIS given: 07/08/09 version given April 17, 2010. Flu Vaccine Consent Questions     Do you have a history of severe allergic reactions to this vaccine? no    Any prior history of allergic reactions to egg and/or gelatin? no    Do you have a sensitivity to the preservative Thimersol? no    Do you have a past history of Guillan-Barre Syndrome? no    Do you currently have an acute febrile illness? no    Have you ever had a  severe reaction to latex? no    Vaccine information given and explained to patient? yes    Are you currently pregnant? no    Lot Number:[id number]   Exp Date:01/31/2011   Site Given  Left Deltoid IMflu   Randell Loop CMA  April 17, 2010 10:00 AM

## 2010-09-02 NOTE — Letter (Signed)
Summary: [facility]   Imported By: Lennie Odor 04/09/2010 14:40:15  _____________________________________________________________________  External Attachment:    Type:   Image     Comment:   External Document

## 2010-09-02 NOTE — Progress Notes (Signed)
Summary: Supporting lab dx code  ---- Converted from flag ---- ---- 05/06/2010 5:47 PM, Michele Mcalpine MD wrote: use 268.9.Marland KitchenMarland Kitchen thanks.  ---- 05/06/2010 3:36 PM, Leodis Liverpool wrote: 473.9, 272.0, and 530.81 were all denied  payment for Vit D performed 04/17/10. Could you send code Medicare reimburses? Thanks Darl Pikes ------------------------------

## 2010-09-02 NOTE — Assessment & Plan Note (Signed)
Summary: Acute NP office visit - bronchitis w/ med refills   Primary Provider/Referring Provider:  Alroy Dust, MD  CC:  OV for med refills.  also states he has had prod cough with thick yellow mucus.  History of Present Illness: 70 yo WM never smoker with known history Asthma and GERD. Take Advair 500/50 once daily . Prilosec once daily  10/04/08 -  follow up visit & review of recent ER eval... he has multiple medical problems as noted below...  states he was doing fine until Dec09 w/ onset of left shoulder pain- treated self w/ Advil/ Tylenol/ Icey Hot & improved after 2-3 weeks... recurrence of pain towards the end of Fef10 & went to an EMT friend who did EKG "for precautionary reasons" & it was OK, but BP was 168/102 (BP's at home usually 130-140's/ 70-80's)... he went to Urgent Care "to be safe & get blood work" they did XRay of chest & shoulder w/ ?of widening of mediastinum- sent to ER where BP was 170/110 initially and grad ret to 130/80 range... eval included CTChest/ Abd/ Pelvis= NEG w/ mult incidental findings- right base atelec, 6mm RLL nodule, right adrenal nodule, cysts in kidneys/ liver, DDD in lumbar sp... XRay CSpine= degen changes, bilat foraminal stenoses, facet arthropathy, anterolisthesis C5 & C6... EKG was neg x PVC, and cardiac enz neg... labs were normal... he was told to f/u w/ [place] for "mass in lung" and DrBerry for "further cardiac testing"- no meds given... he saw DrGamble 3/2 w/ labs done & set up for Myoview...  we discussed the above frantic eval & focused on 3 things:      ** 1)  Neck & shoulder pain- we will start anti-inflamm pain meds, heat, etc; and refer to Neurosurg.      ** 2)  Cardiac eval- already arranged by DrGamble... pt will be reassured by these tests.    October 17, 2009--OV for med refills.  Complains of prod cough with thick yellow mucus , on off for 8 weeks. Worse for last 1 weeks, w/ nasal congestion , stuffiness, cough, congestion. OTC not working. Denies  chest pain, dyspnea, orthopnea, hemoptysis, fever, n/v/d, edema, headache,recent travel or antibiotics.   Medications Prior to Update: 1)  Advair Diskus 500-50 Mcg/dose Misc (Fluticasone-Salmeterol) .... One Puff By Mouth Two Times A Day   Rinse Mouth After Each Use 2)  Omeprazole 40 Mg  Cpdr (Omeprazole) .... Take 1 Tab By Mouth Two Times A Day W/ The Diclofenac. 3)  Diclofenac Sodium 75 Mg Tbec (Diclofenac Sodium) .... Take 1 Tab By Mouth Two Times A Day W/ Food As Needed For Arthritis Pain. 4)  Metamucil 48.57 % Powd (Psyllium) .... Take 1 Tablesp in Water Daily.Marland KitchenMarland Kitchen 5)  Amlodipine Besylate 5 Mg Tabs (Amlodipine Besylate) .... Take 1 Tab By Mouth Once Daily...  Current Medications (verified): 1)  Advair Diskus 500-50 Mcg/dose Misc (Fluticasone-Salmeterol) .... One Puff By Mouth Two Times A Day   Rinse Mouth After Each Use 2)  Omeprazole 40 Mg  Cpdr (Omeprazole) .... Take 1 Tab By Mouth Two Times A Day W/ The Diclofenac. 3)  Diclofenac Sodium 75 Mg Tbec (Diclofenac Sodium) .... Take 1 Tab By Mouth Two Times A Day W/ Food As Needed For Arthritis Pain. 4)  Metamucil 48.57 % Powd (Psyllium) .... Take 1 Tablesp in Water Daily.Marland KitchenMarland Kitchen 5)  Amlodipine Besylate 5 Mg Tabs (Amlodipine Besylate) .... Take 1 Tab By Mouth Once Daily.Marland KitchenMarland Kitchen 6)  Ventolin Hfa 108 (90 Base) Mcg/act Aers (Albuterol  Sulfate) .... 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) DEGENERATIVE DISC DISEASE  (ICD-722.6) NECK PAIN (ICD-723.1) SHOULDER PAIN (ICD-719.41) Hx of BACK PAIN, LUMBAR (ICD-724.2) ANXIETY (ICD-300.00)  Past Surgical History: Last updated: 2008-10-10 S/P tonsillectomy S/P 2 lumbar laminectomies by DrDeaton S/P 3 sinus operations for polyps & sinusitis by DrLawrence S/P achilles tendon repair S/P robotic prostatectomy by DrGrapey  Family History: Last updated: Oct 10, 2008 Father died age 26 w/ MI Mother died age 64 w/ lung cancer 5 Siblings- 3 Bro- one died w/ MI at age 55, one died age 77 w/ heart disease 2 Sis- one w/ thyroid problems & heart disease  Social History: Last updated: 10/17/2009 Married 2 Sons never smoked social alcohol 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   Review of Systems      See HPI  Vital Signs:  Patient profile:   70 year old male Height:      70 inches Weight:      200.50 pounds BMI:     28.87 O2 Sat:      94 % on Room air Temp:     97.4 degrees F oral Pulse rate:   74 / minute BP sitting:   124 / 80  (left arm) Cuff size:   regular  Vitals Entered By: Boone Master CNA (October 17, 2009 3:45 PM)  O2 Flow:  Room air CC: OV for med refills.  also states he has had prod cough with thick yellow mucus Is Patient Diabetic? No Comments Medications reviewed with patient Daytime contact number verified with patient. Boone Master CNA  October 17, 2009 3:45 PM    Physical Exam  Additional Exam:  WD, WN, 70 y/o WM in NAD... he is slightly anxious... GENERAL:  Alert & oriented; pleasant & cooperative... HEENT:  [place]/AT, EOM-wnl, PERRLA, EACs-clear, TMs-wnl, NOSE-clear, THROAT-clear & wnl. NECK:  Supple w/ decrROM; no JVD; normal carotid impulses w/o bruits; no thyromegaly or nodules palpated; no lymphadenopathy. CHEST:   Clear to P & A;  min scat rhonchi without wheezing/ rales/ consolidation... HEART:  Regular Rhythm; without murmurs/ rubs/ or gallops detected... ABDOMEN:  Soft & nontender; normal bowel sounds; no organomegaly or masses palpated... EXT: without deformities, mod arthritic changes; no varicose veins/ venous insuffic/ or edema. BACK:  +tender spots/ trigger points... decr ROM spine... NEURO:  CN's intact;  no focal neuro deficits... DERM:  No lesions noted; no rash etc...     Impression & Recommendations:  Problem # 1:  Hx of ASTHMATIC BRONCHITIS, ACUTE (ICD-466.0) Zpack take as directed.  Mucinex DM two times a day as needed cough/congestion  Increase fluids.  Low fat cholestrol diet Increase activity as tolerated, walking daily  follow up Dr. Kriste Basque in 4-6 months for physical -fasting labs.   His updated medication list for this problem includes:    Advair Diskus 500-50 Mcg/dose Misc (Fluticasone-salmeterol) ..... One puff by mouth two times a day  rinse mouth after each use    Ventolin Hfa 108 (90 Base) Mcg/act Aers (Albuterol sulfate) ..... Inhale 2 puffs every four hours as needed    Zithromax Z-pak 250 Mg Tabs (Azithromycin) .Marland Kitchen... Take as directed.  Medications Added to Medication List This Visit: 1)  Ventolin Hfa 108 (90 Base) Mcg/act Aers (Albuterol sulfate) .... Inhale 2 puffs every four hours as needed 2)  Zithromax Z-pak 250 Mg Tabs (Azithromycin) .... Take as directed.  Complete Medication List: 1)  Advair Diskus 500-50 Mcg/dose Misc (Fluticasone-salmeterol) .... One puff by mouth two times a day   rinse mouth after each use 2)  Omeprazole 40 Mg Cpdr (Omeprazole) .... Take 1 tab by mouth two times a day w/ the diclofenac. 3)  Diclofenac Sodium 75 Mg Tbec (Diclofenac sodium) .... Take 1 tab by mouth two times a day w/ food as needed for arthritis pain. 4)  Metamucil 48.57 % Powd (Psyllium) .... Take 1 tablesp in water daily.Marland KitchenMarland Kitchen 5)  Amlodipine Besylate 5 Mg Tabs (Amlodipine  besylate) .... Take 1 tab by mouth once daily.Marland KitchenMarland Kitchen 6)  Ventolin Hfa 108 (90 Base) Mcg/act Aers (Albuterol sulfate) .... Inhale 2 puffs every four hours as needed 7)  Zithromax Z-pak 250 Mg Tabs (Azithromycin) .... Take as directed.  Other Orders: TLB-BMP (Basic Metabolic Panel-BMET) (80048-METABOL) TLB-CBC Platelet - w/Differential (85025-CBCD) TLB-TSH (Thyroid Stimulating Hormone) (84443-TSH) TLB-Hepatic/Liver Function Pnl (80076-HEPATIC) Est. Patient Level IV ([address])  Patient Instructions: 1)  Zpack take as directed.  2)  Mucinex DM two times a day as needed cough/congestion  3)  Increase fluids.  4)  Low fat cholestrol diet 5)  Increase activity as tolerated, walking daily  6)  follow up Dr. Kriste Basque in 4-6 months for physical -fasting labs.  Prescriptions: ZITHROMAX Z-PAK 250 MG TABS (AZITHROMYCIN) take as directed.  #1 x 0   Entered and Authorized by:   Rubye Oaks NP   Signed by:   Rubye Oaks NP on 10/17/2009   Method used:   [facility]        [facility] [address] (retail)       [address]       [place], [place]  [address]       Ph: [phone] or [phone]       Fax: [phone]   RxID:   [id number] VENTOLIN HFA 108 (90 BASE) MCG/ACT AERS (ALBUTEROL SULFATE) Inhale 2 puffs every four hours as needed  #1 x 0   Entered and Authorized by:   Rubye Oaks NP   Signed by:   Rubye Oaks NP on 10/17/2009   Method used:   [facility]ically to        [facility] [address]* (retail)       [address]       [place], [place]  [address]       Ph: [phone] or [phone]       Fax: [phone]   RxID:   [id number] DICLOFENAC SODIUM 75 MG TBEC (DICLOFENAC SODIUM) take 1 tab by mouth two times a day w/ food as needed for arthritis pain.  #60 x 5   Entered and Authorized by:   Rubye Oaks NP   Signed by:   Rubye Oaks NP on 10/17/2009   Method used:   Electronically to        [facility] [phone]* (retail)       [address]  [place], [place]  [address]       Ph: [phone] or [phone]       Fax: [phone]   RxID:   [id number] AMLODIPINE BESYLATE 5 MG TABS (AMLODIPINE BESYLATE) take 1 tab by mouth once daily...  #90 x 3   Entered and Authorized by:   Rubye Oaks NP   Signed by:   Rubye Oaks NP on 10/17/2009   Method used:   Electronically to        [facility] [address]* (retail)       [address]       [place], [place]  [address]       Ph: [phone] or [phone]       Fax: [phone]   RxID:   [id number] OMEPRAZOLE 40 MG  CPDR (OMEPRAZOLE) take 1 tab by mouth two times a day w/ the Diclofenac.  #180 x 3   Entered and Authorized by:   Rubye Oaks NP   Signed by:   Rubye Oaks NP on 10/17/2009   Method used:   Electronically to        [facility] [address]* (retail)       [address]       [place], [place]  [address]       Ph: [phone] or [phone]       Fax: [phone]   RxID:   [id number] ADVAIR DISKUS 500-50 MCG/DOSE MISC (FLUTICASONE-SALMETEROL) one puff by mouth two times a day   rinse mouth after each use  #3 x 3   Entered and Authorized by:   Rubye Oaks NP   Signed by:   Rubye Oaks NP on 10/17/2009   Method used:   [facility] to        [facility] [address]* (retail)       [address]       [place], [place]  [address]       Ph: [phone] or [phone]       Fax: [phone]   RxID:   [id number]    Immunization History:  Influenza Immunization History:    Influenza:  historical (05/03/2009)

## 2010-09-02 NOTE — Letter (Signed)
[facility]: [facility]   Imported By: Sherian Rein 08/09/2009 13:06:15  _____________________________________________________________________  External Attachment:    Type:   Image     Comment:   External Document

## 2010-09-04 NOTE — Progress Notes (Signed)
Summary: appointment for his wife with Dr. Kriste Basque  Phone Note Call from Patient   Caller: Patient Call For: dr. Kriste Basque Summary of Call: Mr. Santone phoned stated that his wife Armoni Depass date of birth [birth year] saw Dr Kriste Basque about 5 years ago for sinus problems and she is having sinus problems again and he wanted to know if Dr. Kriste Basque would be willing to see her again. Patient can be reached at [phone]  Initial call taken by: Vedia Coffer,  August 06, 2010 10:25 AM  Follow-up for Phone Call        Marliss Czar, will you please ask SN if he remembers this patient and if he is willing to see her again. Thanks.Reynaldo Minium CMA  August 06, 2010 10:53 AM   Additional Follow-up for Phone Call Additional follow up Details #1::        per SN----she will need to follow up with her primary care doctor for this and he will/can refer her to ENT if needed.  thanks Randell Loop CMA  August 06, 2010 11:35 AM     Additional Follow-up for Phone Call Additional follow up Details #2::    pt aware. Carron Curie CMA  August 06, 2010 12:05 PM

## 2010-09-04 NOTE — Progress Notes (Signed)
Summary: request to re-establish w/ sn for pcp - dulpicate phone message  Phone Note Call from Patient   Caller: Spouse Winona Legato Call For: nadel Summary of Call: pt's spouse phyllis Olmeda DOB: [birth year] requests to see dr Kriste Basque for sinus congestion. says he was her pcp "years ago". cell mylo choi) [phone] Initial call taken by: [name], CNA,  July 21, 2010 3:08 PM  Follow-up for Phone Call        duplicate phone note taken on this.  Pls see prior phone note date for 07/21/10 regarding same matter. Follow-up by: [name] RN,  July 21, 2010 4:15 PM

## 2010-09-04 NOTE — Progress Notes (Signed)
Summary: awaiting chart on [facility] for SN to review  Phone Note Call from Patient Call back at Home Phone [phone]   Caller: Spouse/[name] Call For: Dr. Kriste Basque Summary of Call: Patients wife phoned stated that she saw Dr. Kriste Basque several years ago for sinus problems and would like to  be seen by him again. She stated that she called earlier and was told that Dr. Kriste Basque wasn't taking new patients but she said he treated her prior for this problem. [name] would like an appointment with Dr. Kriste Basque if possible. Please call her at [phone] ext [phone] Initial call taken by: Vedia Coffer,  July 21, 2010 10:01 AM  Follow-up for Phone Call        [facility]. There are no previous appts in [facility] for this patient with SN. She does have a MR# [id number]. I have ordered her chart and we will give this to SN once the chart is here so he may review.Michel Bickers [facility]  July 21, 2010 11:54 AM  Pt wife returned call -- duplicate phone message taken.  LMOMTCB to inform her we have requested her chart and will forward to SN once received. [name] RN  July 21, 2010 4:17 PM   Additional Follow-up for Phone Call Additional follow up Details #1::        pt returned call from lori/ triage. pt is at home # now. [name], CNA  July 21, 2010 4:47 PM  Spoke with [name].  She is aware we are waiting on paper chart then will give to SN to review.  Aware she will receive call back once he reviews this. [name] RN  July 21, 2010 5:34 PM     Additional Follow-up for Phone Call Additional follow up Details #2::    Chart received. Pt last seen by [facility] March 2005. Pls advise if you will see this patient.Michel Bickers [facility]  July 22, 2010 1:01 PM  Paper chart received and reviewed by SN.  Per SN, pt was last seen in 2005.  At that time, she had a PCP in [facility]  Pt should see PCP for sinus issue first.  If issue not getting any better, the PCP should make a referral. [name] RN  July 22, 2010 1:36 PM   Called, spoke with pt. She does states PCP, [name], has treated her for this issue x 2 but still no better.  Asked pt to have there office make a referral for this.  She verbalized understanding ok with this. Follow-up by: [name] RN,  July 22, 2010 2:58 PM

## 2010-09-04 NOTE — Progress Notes (Signed)
Summary: Omeprazole PA-just needed to be sent to [facility]-no PA needed  Phone Note Outgoing Call   Call placed by: Reynaldo Minium CMA,  July 24, 2010 3:40 PM Call placed to: PA dept [facility] [phone] Summary of Call: PA sent to our office from [facility] [place] for Omeprazole 40mg  capsule take 1 by mouth two times a day-pt [facility] and insurance requires patient go through [employer] order for all matience meds. I spoke with Mrs Weida who was aware of the PA issue. She agreed for me to send 90 supply to [facility] for patient and will relay message to patient. If any questions or concerns patient will call our office. Initial call taken by: Reynaldo Minium CMA,  July 24, 2010 3:44 PM    New/Updated Medications: OMEPRAZOLE 40 MG  CPDR (OMEPRAZOLE) take 1 tab by mouth two times a day w/ the Diclofenac. Prescriptions: OMEPRAZOLE 40 MG  CPDR (OMEPRAZOLE) take 1 tab by mouth two times a day w/ the Diclofenac.  #180 x 1   Entered by:   Reynaldo Minium CMA   Authorized by:   Michele Mcalpine MD   Signed by:   Reynaldo Minium CMA on 07/24/2010   Method used:   Electronically to        [facility]* (retail)             ,          Ph: [phone]       Fax: [phone]   RxID:   [id number]

## 2010-09-04 NOTE — Letter (Signed)
[facility]: [facility]   Imported By: Sherian Rein 08/26/2010 12:37:37  _____________________________________________________________________  External Attachment:    Type:   Image     Comment:   External Document

## 2010-11-18 LAB — CBC
HCT: 44.5 % (ref 39.0–52.0)
MCHC: 34.5 g/dL (ref 30.0–36.0)
MCV: 94 fL (ref 78.0–100.0)
Platelets: 183 10*3/uL (ref 150–400)
WBC: 8.6 10*3/uL (ref 4.0–10.5)

## 2010-11-18 LAB — APTT: aPTT: 34 seconds (ref 24–37)

## 2010-11-18 LAB — POCT I-STAT, CHEM 8
Calcium, Ion: 1.18 mmol/L (ref 1.12–1.32)
Chloride: 109 mEq/L (ref 96–112)
Glucose, Bld: 90 mg/dL (ref 70–99)
HCT: 47 % (ref 39.0–52.0)
Hemoglobin: 16 g/dL (ref 13.0–17.0)
TCO2: 25 mmol/L (ref 0–100)

## 2010-11-18 LAB — DIFFERENTIAL
Basophils Relative: 0 % (ref 0–1)
Eosinophils Absolute: 0.4 10*3/uL (ref 0.0–0.7)
Lymphs Abs: 1.3 10*3/uL (ref 0.7–4.0)
Monocytes Relative: 8 % (ref 3–12)
Neutro Abs: 6.2 10*3/uL (ref 1.7–7.7)
Neutrophils Relative %: 72 % (ref 43–77)

## 2010-11-18 LAB — POCT CARDIAC MARKERS
CKMB, poc: 1.3 ng/mL (ref 1.0–8.0)
Troponin i, poc: <0.05 ng/mL (ref 0.00–0.09)

## 2010-11-18 LAB — PROTIME-INR
INR: 1 (ref 0.00–1.49)
Prothrombin Time: 13.4 seconds (ref 11.6–15.2)

## 2010-12-19 NOTE — Op Note (Signed)
NAME:  Samuel Levine, Samuel Levine NO.:  [id number]   MEDICAL RECORD NO.:  [id number]          PATIENT TYPE:  OIB   LOCATION:  1441                         FACILITY:  [facility]   PHYSICIAN:  Valetta Fuller, M.D.  DATE OF BIRTH:  [birth year]   DATE OF PROCEDURE:  11/11/2006  DATE OF DISCHARGE:                               OPERATIVE REPORT   PREOPERATIVE DIAGNOSIS:  Adenocarcinoma of the prostate, clinical stage  T2A tumor.   POSTOPERATIVE DIAGNOSIS:  Adenocarcinoma of the prostate, clinical stage  T2A tumor.   PROCEDURE PERFORMED:  Robotic assisted laparoscopic radical retropubic  prostatectomy with bilateral pelvic lymph node dissection.   SURGEON:  Valetta Fuller, M.D.   ASSISTANT:  Crecencio Mc, M.D.   ANESTHESIA:  General endotracheal.   INDICATIONS:  Mr. Gillie is a 70 year old male.  He was sent initially to  see Dr. Brunilda Payor.  He was noted to have a palpable nodule on the right side  of his prostate near the base.  The patient's PSA was approximately 5 at  that time.  Two years ago his PSA was 3.7.  Biopsies were positive at  the right base for a Gleason 4 + 3 = 7 tumor.  The right mid prostate  also showed a Gleason 7 tumor with primary 3 + 4 tumor.  The apex was  negative and the left side was completely negative.  The patient's  prostate was approximately 70 grams.  AUA symptom score was 15 and  preoperative sexual function score was 12/25.  The patient underwent  extensive counseling with regard to his treatment options.  He saw Dr.  Brunilda Payor and subsequently myself.  The patient was referred to [place] for the  possibility of robotic prostatectomy after the patient expressed  interest in that procedure.  We underwent extensive discussion about the  pros and cons of that operation.  We talked about potential  complications and risks.  We specifically further discussed issues with  regard to incontinence and erectile dysfunction.  The patient appeared  to understand the  potential risks and complications and elected to  proceed with that operation.  He presents now for that procedure.  He  has gone through a preoperative physical therapy as well as the course  on the surgery.   TECHNIQUE AND FINDINGS:  The patient received IV Unasyn preoperatively.  He also had sequential compression boots placed.  He was brought to the  operating room where he had successful induction of general endotracheal  anesthesia.  He is placed in the mid lithotomy position and prepped and  draped in the usual manner.  The patient was secured to the table then  placed in a moderate Trendelenburg position.  Foley catheter was placed  sterilely.   Initial incision for the camera port was chosen 18 cm above the pubic  symphysis just to the left of the umbilicus.  A standard open Hasson  technique was utilized.  Finger assessment revealed no obvious adhesions  and the 12-mm port was then placed without difficulty.  The abdomen was  insufflated and carefully  inspected.  A position of the trocar was  ideal.  There was no evidence of any inner abdominal adhesions.  No  other abnormalities.  All the additional ports were placed with direct  visual guidance.  This included three robotic 8-mm ports.  An additional  12-mm and 5-mm assist port were also placed.  Once all ports were in  good position, the robot was docked.   Attention was first turned towards entering the retropubic space by  development of the space of Retzius.  This was done by reflecting the  bladder posteriorly with the use electrocautery scissors.  Overlying fat  and superficial dorsal vein tissue were then taken down with  electrocautery to really identify the bladder neck and the overlying  endopelvic fascia off the prostate.  The endopelvic fascia was then  incised from apex to base on both sides.  Levator musculature was swept  away from the prostate, especially near the apex utilizing a combination  of sharp and  blunt dissecting technique.  The puboprostatic ligaments  were taken down sharply.  This allowed [place] to have a great visualization  of the apex of the prostate, the dorsal vein complex and underlying  urethra.  Once the notches were identified bilaterally, we went ahead  with an ETS stapling device and stapled off the dorsal vein.  Hemostasis  was quite good.  The bladder neck was then identified with use of the  Foley catheter in the balloon.  The anterior bladder neck was then  incised with electrocautery scissors.  The Foley catheter was identified  the midline and then brought out the bladder neck and used to retract  the prostate anteriorly.  Careful inspection revealed no large component  of the middle lobe.  Indigo carmine was given and the orifices appeared  to be well proximal to the bladder neck incision.  The posterior bladder  neck was then incised.  This incision was then carried down until the  vas deferens and seminal vesicles were identified.  These structures  were individually dissected free.  Once that was accomplished, we were  able to establish a nice plane posteriorly between the prostate and  rectum.   Attention was then turned towards nerve spare.  We felt that the patient  should only have a nerve spare on the left side.  Fascia was incised on  the lateral aspect of the prostate which allowed for establishment of  the groove between the fascial layers and blunt dissection was then used  to free the neurovascular bundle off the posterior lateral aspect of the  prostate of the left side.  Once this was accomplished, the pedicles  were taken down.  Again nerve spare was done on the left side.  On the  right the tissues were taken widely.  Hematoclips were used primarily  for this.  At that point the prostate was completely free other than the  apical tissues.  A very small amount of additional dorsal vein complex was incised and the anterior urethra was transected.   The Foley catheter  was retracted and the posterior aspect the urethra completely were  transected leaving a very nice urethral stump.  The prostate specimen  was then put outside the pelvis into the lower abdomen.   Attention was then turned towards bilateral pelvic lymph node  dissection.  In essence primarily the obturator packets were taken  bilaterally.  Underlying obturator nerve and vessels were identified and  preserved.  No obvious enlarged pathologic  lymph nodes were identified.  Hematoclips were used for the lymph node dissection.   Attention was then turned towards reconstruction.  A single interrupted  Vicryl suture was used at the 6 o'clock position between the posterior  urethra and bladder neck at the 6 o'clock position.  This was then tied  to reapproximate those structures.  A 3-0 double-armed Monocryl suture  was then used to perform a running 360 degrees anastomosis.  This  appeared to be watertight.  The new coude catheter was placed without  difficulty.  The bladder was irrigated and no leakage was noted..  Of  note, prior to reconstruction, the pelvis had been copiously irrigated  and the rectal Foley had been inflated without evidence of any rectal  injury.  A drain was placed through one of the robotic ports and placed  in the retropubic space and this was then anchored to the abdominal  wall.  The specimen was retrieved with a EndoCatch bag.  The camera port  incision did had to be extended slightly to remove the specimen which  was quite large.  The 12-mm port was closed with a Vicryl suture and  then suture passer with an endoscopic visual guidance.  The other port  sites were closed simply with surgical clips.  All ports were removed  with direct visual guidance.  The camera port incision was closed with a  running 0-0 Vicryl suture.  The patient appeared to tolerate the  procedure very well.  Blood loss was approximately 150 mL.  No obvious  complications  or problems occurred.  The patient was brought to recovery  room in stable condition.           ______________________________  Valetta Fuller, M.D.  Electronically Signed     DSG/MEDQ  D:  11/11/2006  T:  11/11/2006  Job:  [id number]   cc:   Lonzo Cloud. Kriste Basque, MD  [address]  [place]  [place] [address]

## 2010-12-31 ENCOUNTER — Other Ambulatory Visit: Payer: Self-pay | Admitting: Adult Health

## 2011-01-07 NOTE — Telephone Encounter (Signed)
Last ov was cpx with SN - told to follow up in 1 year.

## 2011-03-18 ENCOUNTER — Other Ambulatory Visit: Payer: Self-pay | Admitting: Adult Health

## 2011-04-29 ENCOUNTER — Encounter: Payer: Self-pay | Admitting: Pulmonary Disease

## 2011-05-04 ENCOUNTER — Other Ambulatory Visit (INDEPENDENT_AMBULATORY_CARE_PROVIDER_SITE_OTHER): Payer: Medicare Other

## 2011-05-04 ENCOUNTER — Encounter: Payer: Self-pay | Admitting: Pulmonary Disease

## 2011-05-04 ENCOUNTER — Ambulatory Visit (INDEPENDENT_AMBULATORY_CARE_PROVIDER_SITE_OTHER): Payer: Medicare Other | Admitting: Pulmonary Disease

## 2011-05-04 ENCOUNTER — Ambulatory Visit (INDEPENDENT_AMBULATORY_CARE_PROVIDER_SITE_OTHER)
Admission: RE | Admit: 2011-05-04 | Discharge: 2011-05-04 | Disposition: A | Discharge status: Home / Self Care | Payer: Medicare Other | Source: Ambulatory Visit | Attending: Pulmonary Disease | Admitting: Pulmonary Disease

## 2011-05-04 ENCOUNTER — Other Ambulatory Visit: Payer: Self-pay | Admitting: Pulmonary Disease

## 2011-05-04 DIAGNOSIS — I059 Rheumatic mitral valve disease, unspecified: Secondary | ICD-10-CM

## 2011-05-04 DIAGNOSIS — K5730 Diverticulosis of large intestine without perforation or abscess without bleeding: Secondary | ICD-10-CM

## 2011-05-04 DIAGNOSIS — E7800 Pure hypercholesterolemia, unspecified: Secondary | ICD-10-CM

## 2011-05-04 DIAGNOSIS — J45909 Unspecified asthma, uncomplicated: Secondary | ICD-10-CM

## 2011-05-04 DIAGNOSIS — C61 Malignant neoplasm of prostate: Secondary | ICD-10-CM

## 2011-05-04 DIAGNOSIS — J309 Allergic rhinitis, unspecified: Secondary | ICD-10-CM

## 2011-05-04 DIAGNOSIS — Z86010 Personal history of colonic polyps: Secondary | ICD-10-CM

## 2011-05-04 DIAGNOSIS — Z23 Encounter for immunization: Secondary | ICD-10-CM

## 2011-05-04 DIAGNOSIS — IMO0002 Reserved for concepts with insufficient information to code with codable children: Secondary | ICD-10-CM

## 2011-05-04 DIAGNOSIS — K219 Gastro-esophageal reflux disease without esophagitis: Secondary | ICD-10-CM

## 2011-05-04 DIAGNOSIS — J984 Other disorders of lung: Secondary | ICD-10-CM

## 2011-05-04 DIAGNOSIS — F411 Generalized anxiety disorder: Secondary | ICD-10-CM

## 2011-05-04 DIAGNOSIS — F419 Anxiety disorder, unspecified: Secondary | ICD-10-CM

## 2011-05-04 DIAGNOSIS — N200 Calculus of kidney: Secondary | ICD-10-CM

## 2011-05-04 LAB — BASIC METABOLIC PANEL
BUN: 18 mg/dL (ref 6–23)
Calcium: 8.9 mg/dL (ref 8.4–10.5)
Creatinine, Ser: 1.1 mg/dL (ref 0.4–1.5)
GFR: 70.43 mL/min (ref >60.00)
Glucose, Bld: 101 mg/dL — ABNORMAL HIGH (ref 70–99)
Potassium: 3.9 mEq/L (ref 3.5–5.1)

## 2011-05-04 LAB — CBC WITH DIFFERENTIAL/PLATELET
Basophils Relative: 0.3 % (ref 0.0–3.0)
Eosinophils Relative: 4.9 % (ref 0.0–5.0)
HCT: 44.9 % (ref 39.0–52.0)
Lymphs Abs: 1.1 10*3/uL (ref 0.7–4.0)
Monocytes Relative: 7.1 % (ref 3.0–12.0)
Platelets: 197 10*3/uL (ref 150.0–400.0)
RBC: 4.73 Mil/uL (ref 4.22–5.81)
WBC: 6.8 10*3/uL (ref 4.5–10.5)

## 2011-05-04 LAB — LDL CHOLESTEROL, DIRECT: Direct LDL: 196.3 mg/dL

## 2011-05-04 LAB — LIPID PANEL
Cholesterol: 259 mg/dL — ABNORMAL HIGH (ref 0–200)
HDL: 36.8 mg/dL — ABNORMAL LOW (ref >39.00)
Triglycerides: 155 mg/dL — ABNORMAL HIGH (ref 0.0–149.0)
VLDL: 31 mg/dL (ref 0.0–40.0)

## 2011-05-04 LAB — HEPATIC FUNCTION PANEL
AST: 17 U/L (ref 0–37)
Albumin: 4.2 g/dL (ref 3.5–5.2)
Total Bilirubin: 1 mg/dL (ref 0.3–1.2)

## 2011-05-04 LAB — TSH: TSH: 1.68 u[IU]/mL (ref 0.35–5.50)

## 2011-05-04 MED ORDER — METHYLPREDNISOLONE ACETATE 80 MG/ML IJ SUSP
80.0000 mg | Freq: Once | INTRAMUSCULAR | Status: AC
  Administered 2011-05-04: 80 mg via INTRA_ARTICULAR

## 2011-05-04 NOTE — Progress Notes (Signed)
Subjective:    Patient ID: Samuel Levine, male    DOB: 04-12-1941, 70 y.o.   MRN: [id number]  HPI 70 y/o WM here for a follow up visit... he has multiple medical problems as noted below...    ~  states he was doing fine until Dec09 w/ onset of left shoulder pain- treated self w/ Advil/ Tylenol/ Icey Hot & improved after 2-3 weeks... recurrence of pain towards the end of Feb10 & went to an EMT friend who did EKG "for precautionary reasons" & it was OK, but BP was 168/102 (BP's at home usually 130-140's/ 70-80's)... he went to Urgent Care "to be safe & get blood work" they did XRay of chest & shoulder w/ ?of widening of mediastinum- sent to ER where BP was 170/110 initially and grad ret to 130/80 range... eval included CTChest/ Abd/ Pelvis= NEG w/ mult incidental findings- right base atelec, 6mm RLL nodule, right adrenal nodule, cysts in kidneys/ liver, DDD in lumbar sp... XRay CSpine= degen changes, bilat foraminal stenoses, facet arthropathy, anterolisthesis C5 & C6... EKG was neg x PVC, and cardiac enz neg... labs were normal... he was told to f/u w/ Samuel Levine for "mass in lung" and Samuel Levine for "further cardiac testing"- no meds given... he saw Samuel Levine 3/10 w/ labs done & set up for Myoview...  ~  Mar10:  we discussed the above frantic eval & focused on 3 things: 1)  Neck & shoulder pain- we will start anti-inflamm pain meds, heat, etc; and refer to Neurosurg; 2)  Cardiac eval- already arranged by Samuel Levine & Amlodipine 5mg  started... pt will be reassured by these tests; 3)  6mm RLL lung nodule- pt is a non-smoker, hx of asthma, no chest symptoms- f/u CXR planned...  ~  April 17, 2010:  19mo ROV- he saw Samuel Levine 3/10 w/ 2DEcho (normal, x mild AI) & Myoview (neg- no ischemia, EF= 63%)... he had a colonoscopy by Samuel Levine 4/10- mild divertics, sm nodule in sigmid, bx= tubular adenoma & f/u planned 65yrs...  he saw Samuel Levine for Urology 8/11 for f/u prostate cancer s/p prostatectomy & then had adjuvant XRT for  rising PSA post surg & is followed every 4-61mo w/ continued slow rise in PSA to 0.8- 8/11(watchful waiting protocol before considering hormonal therapy)...  he feels his Asthma is under good control;  CXR today for f/u 6mm nodule-  no change;  BP undr good conrol, denies CP, etc;  Chol is poorly controlled on diet alone- but he is intol to all statins etc;  otherw stable w/o new complaints or concerns he says.  ~  May 04, 2011:  Yearly ROV & [name] reports several problems this yr>  He had 1st ever kidney stone event (2mm stone passed spont per Samuel Levine); he continues f/u of his recurrent prostate cancer every 39mo from Urology w/ slow rise in his PSA up to 1.47 in May, doubling time ~6-97months, per Samuel Levine the plan is to continue surveillance & intervene w/ hormone therapy when PSA approaches 10...  Samuel Levine also had some incr allergy manifestations w/ several episodes of lip tingling & swelling- he treated self w/ Benedryl w/ resolution; pt not on ACE med; on careful questioning it seems he restarted ASA 81mg  despite his hx ASA sensitivity & Triad Asthma (thankfully no severe reaction)> I told him the ASA was prob the culprit & to stop this med immediately, use Tylenol for pain, etc...  Due for CXR (NAD, ? nodule- check CT for completeness) & Fasting labs today (FLP looks  terrible & [facility] has him on diet alone, intol to all statins), ok Flu shot, he wants Depo as well...          Problem List:   ALLERGIC RHINITIS (ICD-477.9) & Hx of SINUSITIS, RECURRENT (ICD-473.9) - he uses OTC antihistamines Prn... he has a long hx of allergies w/ pos testing to dust mostly... he had nasal polyp surg in 1985... he is ASPIRIN sensitive (Triad Asthma)... he had additional sinus surgery by Samuel Levine in 1997... ~  9/11:  we discussed chr sinus regimen w/ Antihist in AM, Saline nasal pray, & FLONASE Qhs. ~  10/12:  He restarted ASA on his own & has had some lip swelling> pt told to stop ASA immed & ok to Rx w/  Benedryl...  ASTHMA (ICD-493.90) & Hx of ASTHMATIC BRONCHITIS, ACUTE (ICD-466.0) - on ADVAIR 500Bid & VENTOLIN Prn w/ good control... prev on Singulair as well but he stopped this on his own.  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  repeat CXR 9/11 showed clear lungs, norm heart size, no nodule seen... ~  10/12:  CXR shows NAD; radiology queries 8mm RLL nodule & they suggest f/u CT to eval...  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 he had a negative cath in 1992- no coronary disease, min MVP seen... he has a hx of MVP and occas ectopy w/ incr BP response to exercise... given low dose Atenolol in past... 2DEcho and Cardiolite in 2005 were both reported normal by Samuel Levine... ~  3/10:  repeat cardiac eval by Samuel Levine w/ 2DEcho norm x mild AI, & neg Myoview- no ischemia, EF=63%... started AMLODIPINE 5mg /d.  HYPERCHOLESTEROLEMIA (ICD-272.0) - eval and management by Samuel Levine for [facility]... pt reports being tried on numerous Statins and Zetia & states INTOL to all these meds... in 2008 he stopped Zocor40 due to aching and started Grapeseed extract made by his cousin in [facility] (he reports that this caused bone pain as well)... ~  FLP here 7/08 showed TChol 193, TG 73, HDL 30, LDL 148... rec- incr Simva20 to 40mg /d (Intol & stopped by pt). ~  He reports interim FLPs by Samuel Levine... ~  FLP here 9/11 showed TChol 235, TG 153, HDL 32, LDL 184... needs better diet & exercise program. ~  FLP here 10/12 on diet alone showed TChol 259, TG 155, HDL 37, LDL 196... Reports INTOL to ALL meds, he at least needs a trial in the LipidClinic.  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 ~  he had a 24H pH probe in 1991 which was unremarkable... ~  EGD 4/07 by Samuel Levine showed 8cmHH, cameron  erosions, chr GERD, & gastritis... HPylori was neg.  DIVERTICULOSIS, COLON (ICD-562.10) & COLONIC POLYPS (ICD-211.3) - he had a lower GI Bleed & was hosp at [facility] in [place] 2/07- required 6u blood, did not require surgery... he takes METAMUCIL daily... ~  colonoscopy 4/07 by Samuel Levine showed divertics, 6mm polyp in asc colon= adenomatous w/ f/u planned for 4yrs... ~  CT Abd/ Pelvis Feb10= NEG w/ mult incidental findings- right base atelec, 6mm RLL nodule, right adrenal nodule, cysts in kidneys/ liver, DDD in lumbar sp... ~  colonoscopy 4/10 by Samuel Levine showed divertics & nodule in sigmoid= tubular adenoma on bx; f/u planned 99yrs.  NEPHROLITHIASIS >>  first & only kidney stone 5/12 ~63mm & he passed it, no other stones seen... Hx of PROSTATE CANCER (ICD-185) - found to have right postate nodule on DRE 1/08 during his last CPX... PSA= 4.87... referred to Urology w/ pos biopies and robotic prostatectomy 4/08 by Samuel Levine- he had capsular extension but neg margins etc... subseq PSA's were nondetectable, but then started to rise again and was 0.22 in Jul09- therefore sent for adjuvant XRT which he tolerated well w/ decr in the PSA... pt still followed by Samuel Levine every 4-6 months due to continued slow rise in PSA post XRT... ~  5/12:  Seen by Samuel Levine for f/u of his recurrent prostate cancer (every 25mo) w/ slow rise in his PSA up to 1.47 in [id number], doubling time ~6-49months, per Samuel Levine the plan is to continue surveillance & intervene w/ hormone therapy when PSA approaches 10...  DEGENERATIVE DISC DISEASE (ICD-722.6) - on VOLTAREN 75mg Bid Prn (he apparently tolerates NSAIDs OK despite his hx Triad Asthma & ASA sensitivity)...  NECK PAIN (ICD-723.1) & SHOULDER PAIN (ICD-719.41) ~  XRays Feb10 from ER showed  degen changes, bilat foraminal stenoses, facet arthropathy, anterolisthesis C5 & C6.  Hx of BACK PAIN, LUMBAR (ICD-724.2) - he had 2 lumbar laminectomies in the past by DrDeaton...    ANXIETY (ICD-300.00)   Past Surgical History  Procedure Date  . Tonsillectomy   . Lumbar laminectomy   . Achilles tendon repair   . Prostatectomy     Outpatient Encounter Prescriptions as of 05/04/2011  Medication Sig Dispense Refill  . ADVAIR DISKUS 500-50 MCG/DOSE AEPB ONE PUFF BY MOUTH TWO TIMES A DAY RINSE MOUTH AFTER EACH USE  1 each  5  . amLODipine (NORVASC) 5 MG tablet Take 5 mg by mouth daily.        Marland Kitchen aspirin 81 MG tablet Take 81 mg by mouth daily.        . diclofenac (VOLTAREN) 75 MG EC tablet Take 75 mg by mouth 2 (two) times daily.        . fexofenadine (ALLEGRA) 180 MG tablet Take 180 mg by mouth daily.        . fluticasone (VERAMYST) 27.5 MCG/SPRAY nasal spray Place 2 sprays into the nose daily.        . pantoprazole (PROTONIX) 40 MG tablet Take 40 mg by mouth daily.        . psyllium (METAMUCIL SMOOTH TEXTURE) 28 % packet Take 1 packet by mouth daily.        . VENTOLIN HFA 108 (90 BASE) MCG/ACT inhaler INHALE 2 PUFFS EVERY FOUR HOURS AS NEEDED  1 Inhaler  5  . DISCONTD: omeprazole (PRILOSEC) 40 MG capsule Take 40 mg by mouth daily.        Marland Kitchen DISCONTD: psyllium (REGULOID) 0.52 G capsule Take 0.52 g by mouth daily.          Allergies  Allergen Reactions  . Aspirin     REACTION: causes asthma flare    Current Medications, Allergies, Past Medical History, Past Surgical History, Family History, and Social History were reviewed in [employer] record.    Review of Systems         The patient complains of nasal congestion, constipation, gas/bloating, stiffness, arthritis, and hay fever.  The patient denies fever, chills, sweats, anorexia, fatigue, weakness, malaise, weight loss, sleep disorder, blurring, diplopia, eye irritation, eye discharge, vision loss, eye pain, photophobia, earache, ear discharge, tinnitus, decreased hearing, nosebleeds, sore throat, hoarseness, chest pain, palpitations, syncope,  dyspnea on exertion, orthopnea, PND,  peripheral edema, cough, dyspnea at rest, excessive sputum, hemoptysis, wheezing, pleurisy, nausea, vomiting, diarrhea, change in bowel habits, abdominal pain, melena, hematochezia, jaundice, indigestion/heartburn, dysphagia, odynophagia, dysuria, hematuria, urinary frequency, urinary hesitancy, nocturia, incontinence, back pain, joint pain, joint swelling, muscle cramps, muscle weakness, sciatica, restless legs, leg pain at night, leg pain with exertion, rash, itching, dryness, suspicious lesions, paralysis, paresthesias, seizures, tremors, vertigo, transient blindness, frequent falls, frequent headaches, difficulty walking, depression, anxiety, memory loss, confusion, cold intolerance, heat intolerance, polydipsia, polyphagia, polyuria, unusual weight change, abnormal bruising, bleeding, enlarged lymph nodes, urticaria, allergic rash, and recurrent infections.     Objective:   Physical Exam     WD, WN, 70 y/o WM in NAD...  GENERAL:  Alert & oriented; pleasant & cooperative... HEENT:  [place]/AT, EOM-wnl, PERRLA, EACs-clear, TMs-wnl, NOSE-congested, THROAT-clear & wnl. NECK:  Supple w/ decrROM; no JVD; normal carotid impulses w/o bruits; no thyromegaly or nodules palpated; no lymphadenopathy. CHEST:  Clear to P & A;  min scat rhonchi without wheezing/ rales/ consolidation... HEART:  Regular Rhythm; without murmurs/ rubs/ or gallops detected... ABDOMEN:  Soft & nontender; normal bowel sounds; no organomegaly or masses palpated... EXT: without deformities, mod arthritic changes; no varicose veins/ venous insuffic/ or edema. BACK:  +tender spots/ trigger points... decr ROM spine... NEURO:  CN's intact;  no focal neuro deficits... DERM:  No lesions noted; no rash etc...   Assessment & Plan:   AR & Asthma>  He needs to STOP the ASA; continue Advair regularly + Ventolin rescue; use MUCINEX, Benedryl as needed...  PULM NODULE>  Hx tiny 6mm periph RLL nodule seen on CT 2/10 (done in ER, incidental  finding) & not prev seen on CXR; radiologist queries 8mm nodule on today's film & suggests f/u CT ==> pending  CARDS> HxCP, mildMVP, mildAI>  Followed by [facility], Samuel [facility]ine (?when last visit); on Amlodipine5mg  & stable- denies CP, palpit, dizzy, SOB, etc; he will call for ROV...  CHOL>  He has been INTOL to all statins & he tells me [facility] said just to stay on diet despite his poor numbers; LDL tosay is 196 & I feel he deserves a trial in the Lipid Clinic if he will go...  LARGE HH/ GERD>  8cmHH mon EGD by [facility], pt on Protonix Bid; continue same med...  Divertics/ Colon Polyps>  Hx lower GIB in 2007, he uses Metamucil daily, last colon 4/10 w/ adenoma & f/u planned 59yrs...  Prostate Ca/ KidStone>  Followed by Samuel Levine, s/p surg & XRT, on watchful waiting w/ slow rising PSA...  DJD, Neck pain, Shoulder pain, LBP>  On Voltaren prn...  Anxiety>  He copes very well & does not require anxiolytic RX.Marland KitchenMarland Kitchen

## 2011-05-04 NOTE — Patient Instructions (Signed)
Today we updated your med list in EPIC...    Be sure that you STOP the Aspirin due to your hx aspirin sensitivity & the recent lip swelling...  Today we did your follow up CXR & fasting blood work...    Please call the PHONE TREE in a few days for your results...    Dial [id number] & when prompted enter your patient number followed by the # symbol...    Your patient number is:  [id number]#  We gave you a Depo shot to help w/ your allergies...    ... and the seasonal FLU vaccine today...  Call for any questions...    Let's get on track w/ our diet & wt reduction program...  Let's plan another routine follow up in 43yr, sooner if needed for problems.Marland KitchenMarland Kitchen

## 2011-05-06 ENCOUNTER — Encounter: Payer: Self-pay | Admitting: Pulmonary Disease

## 2011-05-06 ENCOUNTER — Other Ambulatory Visit: Payer: Self-pay | Admitting: Pulmonary Disease

## 2011-05-06 DIAGNOSIS — J984 Other disorders of lung: Secondary | ICD-10-CM

## 2011-05-06 DIAGNOSIS — E7800 Pure hypercholesterolemia, unspecified: Secondary | ICD-10-CM

## 2011-05-14 ENCOUNTER — Ambulatory Visit (INDEPENDENT_AMBULATORY_CARE_PROVIDER_SITE_OTHER): Payer: Medicare Other | Admitting: Pharmacist

## 2011-05-14 ENCOUNTER — Ambulatory Visit (INDEPENDENT_AMBULATORY_CARE_PROVIDER_SITE_OTHER)
Admission: RE | Admit: 2011-05-14 | Discharge: 2011-05-14 | Disposition: A | Discharge status: Home / Self Care | Payer: Medicare Other | Source: Ambulatory Visit | Attending: Pulmonary Disease | Admitting: Pulmonary Disease

## 2011-05-14 VITALS — Wt 197.2 lb

## 2011-05-14 DIAGNOSIS — J984 Other disorders of lung: Secondary | ICD-10-CM

## 2011-05-14 DIAGNOSIS — E7800 Pure hypercholesterolemia, unspecified: Secondary | ICD-10-CM

## 2011-05-14 HISTORY — DX: Essential (primary) hypertension: I10

## 2011-05-14 MED ORDER — IOHEXOL 300 MG/ML  SOLN
80.0000 mL | Freq: Once | INTRAMUSCULAR | Status: AC | PRN
  Administered 2011-05-14: 80 mL via INTRAVENOUS

## 2011-05-14 NOTE — Patient Instructions (Addendum)
1) Start Niaspan 500 mg every day (may want to take before bed in case of flushing reaction). Let us know if you experience flushing. 2) Try to make small changes to diet, incorporating fresh fruits and vegetables, as well as reducing the amount of meals you eat out. 3) Increase the amount of water that you drink in a day. 4) Try to avoid eating sweets as meals- use as a treat. 5) Incorporate physical activity (~30 minutes) into your routine 2 times per week (outside of work).  Follow up with lipid clinic: Thursday, November 29

## 2011-05-14 NOTE — Progress Notes (Signed)
Samuel Levine is a 70 year old white male referred to the lipid clinic by Dr. Kriste Basque for dyslipidemia management. He has failed several trials of multiple cholesterol medications (Lipitor, Simvastatin, Vytorin, Zetia), and has not been on a cholesterol med in the past 3-4 years.   Diet: Overall diet has room for improvement. Breakfast on weekdays is a biscuit (sausage or pork chop) with sweet tea and on weekends is bacon and eggs. Lunch is generally fast food- cheeseburgers with french fries and sweet tea. Occasionally goes to [employer] for things like meatloaf. Dinner is often take out, as both Samuel Levine and his wife work long hours. Meals consist of potatoes, beef stew, meatloaf, grilled chicken salad with thousand island dressing, and [employer] with artificial sweetener. Patient enjoys sweets, such as pies and cobblers, and states he will sometimes have a sweet instead of dinner. Denies any snacking. Drinks minimal amounts of water throughout the day, though he keeps a cooler in the back of his car.  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  Social History: Samuel Levine denies smoking (past or present), and he does not drink alcohol.   Current Outpatient Prescriptions on File Prior to Visit  Medication Sig Dispense Refill  . ADVAIR DISKUS 500-50 MCG/DOSE AEPB ONE PUFF BY MOUTH TWO TIMES A DAY RINSE MOUTH AFTER EACH USE  1 each  5  . amLODipine (NORVASC) 5 MG tablet Take 5 mg by mouth daily.        . diclofenac (VOLTAREN) 75 MG EC tablet Take 75 mg by mouth 2 (two) times daily.        . fexofenadine (ALLEGRA) 180 MG tablet Take 180 mg by mouth daily.        . fluticasone (VERAMYST) 27.5 MCG/SPRAY nasal spray Place 2 sprays into the nose daily.        . pantoprazole (PROTONIX) 40 MG tablet Take 40 mg by mouth daily.        . psyllium (METAMUCIL SMOOTH TEXTURE) 28 % packet Take 1 packet  by mouth daily.        . VENTOLIN HFA 108 (90 BASE) MCG/ACT inhaler INHALE 2 PUFFS EVERY FOUR HOURS AS NEEDED  1 Inhaler  5   No current facility-administered medications on file prior to visit.

## 2011-05-14 NOTE — Assessment & Plan Note (Addendum)
Assessment: Lab values from 05/04/11: TC 259 (goal <200), TG 155 (goal <150), HDL 36.8 (goal >45), LDL 196 (goal <130). LFTs WNL. Cardiac risk factors include age >106, HTN, and HDL <40. Dyslipidemia currently uncontrolled with patient intolerant to multiple treatment modalities.   Plan: Will begin treatment with Niaspan 500 mg daily, secondary to history of treatment failures and need for improvement in LDL, HDL, and TG. Patient counseled on medication, as well as lifestyle modifications. Given directions below and will follow up with lipid clinic in 1 month to reevaluate.    1) Start Niaspan 500 mg every day (may want to take before bed in case of flushing reaction). Let us know if you experience flushing. 2) Try to make small changes to diet, incorporating fresh fruits and vegetables, as well as reducing the amount of meals you eat out. 3) Increase the amount of water that you drink in a day. 4) Try to avoid eating sweets as meals- use as a treat. 5) Incorporate physical activity (~30 minutes) into your routine 2 times per week (outside of work).

## 2011-05-15 ENCOUNTER — Encounter: Payer: Self-pay | Admitting: Pulmonary Disease

## 2011-05-18 MED ORDER — NIACIN ER (ANTIHYPERLIPIDEMIC) 500 MG PO TBCR: 500.0000 mg | EXTENDED_RELEASE_TABLET | Freq: Every day | ORAL | Status: DC

## 2011-06-08 ENCOUNTER — Telehealth: Payer: Self-pay | Admitting: Pharmacist

## 2011-06-08 DIAGNOSIS — E7800 Pure hypercholesterolemia, unspecified: Secondary | ICD-10-CM

## 2011-06-08 MED ORDER — NIACIN ER (ANTIHYPERLIPIDEMIC) 500 MG PO TBCR: 500.0000 mg | EXTENDED_RELEASE_TABLET | Freq: Every day | ORAL | Status: DC

## 2011-06-08 NOTE — Telephone Encounter (Signed)
Pt coumadin pt and was given samples of niacin , and needs [facility] [place]

## 2011-06-30 ENCOUNTER — Other Ambulatory Visit: Payer: Self-pay | Admitting: Pulmonary Disease

## 2011-06-30 ENCOUNTER — Other Ambulatory Visit (INDEPENDENT_AMBULATORY_CARE_PROVIDER_SITE_OTHER): Payer: Medicare Other | Admitting: *Deleted

## 2011-06-30 DIAGNOSIS — E7800 Pure hypercholesterolemia, unspecified: Secondary | ICD-10-CM

## 2011-06-30 LAB — LIPID PANEL
Cholesterol: 234 mg/dL — ABNORMAL HIGH (ref 0–200)
HDL: 36 mg/dL — ABNORMAL LOW (ref >39.00)
Triglycerides: 109 mg/dL (ref 0.0–149.0)
VLDL: 21.8 mg/dL (ref 0.0–40.0)

## 2011-06-30 LAB — HEPATIC FUNCTION PANEL
Albumin: 3.9 g/dL (ref 3.5–5.2)
Alkaline Phosphatase: 77 U/L (ref 39–117)
Total Protein: 6.7 g/dL (ref 6.0–8.3)

## 2011-07-02 ENCOUNTER — Ambulatory Visit (INDEPENDENT_AMBULATORY_CARE_PROVIDER_SITE_OTHER): Payer: Medicare Other | Admitting: Pharmacist

## 2011-07-02 VITALS — BP 128/84 | Wt 197.0 lb

## 2011-07-02 DIAGNOSIS — E7800 Pure hypercholesterolemia, unspecified: Secondary | ICD-10-CM

## 2011-07-02 NOTE — Progress Notes (Signed)
Samuel Levine is a 70 year old white male returning to the lipid clinic for 1 month follow-up of dyslipidemia.   Current lipid regimen includes Niaspan 500 mg qHS.  He has failed several trials of cholesterol medications (Crestor, Lipitor, Simvastatin, Vytorin, Zetia) due to myalgias and impaired memory.  He reports compliance with Niaspan and denies any adverse events.   Diet:  Patient has attempted to make some changes to diet but continues to have room for improvement. Breakfast on weekdays still includes a biscuit (sausage or pork chop) with sweet tea, although patient has attempted to limit this to only 2-3 times per week vs daily.  Other days of the week he has fresh fruit for breakfast (apple, banana, etc).  Lunch is generally a meal from the local cafe.  Patient has attempted to limit cheeseburgers to only 3-4 times per week vs daily.  Other days he opts for a healthier meal from cafe that includes a meat and vegetables. Occasionally goes to [employer] for things like meatloaf. Dinner is often take out, as both Samuel Levine and his wife work long hours. Meals consist of potatoes, beef stew, meatloaf, grilled chicken salad at least twice weekly with thousand island dressing.  Patient enjoys sweets, such as pies and cobblers.  He reports limiting sweets to only twice per week.  Pt has done a good job of drinking more water.  He also takes a daily fiber supplement (metamucil).   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  He does not report any additional physical activity outside of work, but reports that he does have trails across his property that would be good for walking.    Social History:  Does not smoke, does not drink alcohol.    Current Outpatient Prescriptions on File Prior to Visit  Medication Sig Dispense Refill  . ADVAIR DISKUS 500-50 MCG/DOSE AEPB ONE PUFF BY MOUTH TWO TIMES A DAY RINSE MOUTH AFTER EACH  USE  1 each  5  . amLODipine (NORVASC) 5 MG tablet Take 5 mg by mouth daily.        . diclofenac (VOLTAREN) 75 MG EC tablet Take 75 mg by mouth 2 (two) times daily.        . fexofenadine (ALLEGRA) 180 MG tablet Take 180 mg by mouth daily.        . fluticasone (VERAMYST) 27.5 MCG/SPRAY nasal spray Place 2 sprays into the nose daily.        . pantoprazole (PROTONIX) 40 MG tablet Take 40 mg by mouth daily.        . psyllium (METAMUCIL SMOOTH TEXTURE) 28 % packet Take 1 packet by mouth daily.        . VENTOLIN HFA 108 (90 BASE) MCG/ACT inhaler INHALE 2 PUFFS EVERY FOUR HOURS AS NEEDED  1 Inhaler  5   No current facility-administered medications on file prior to visit.

## 2011-07-02 NOTE — Patient Instructions (Signed)
1)  No medication changes, continue with Niaspan 500 mg daily at bedtime 2)  Limit burgers to only twice per week. 3)  Watch portion sizes and sweets intake over the holidays 4)  Attempt to walk for 20-30 min at least once per week  5)  Return for follow-up in 2 months  Lipid clinic: Thursday, January 24th @ 8:45 Labwork: Monday, January 21st @ 9:30 (can come as early as 8:30 am) - FASTING

## 2011-07-02 NOTE — Assessment & Plan Note (Addendum)
Lipid values are as follows:  TC 234 (goal <200), TG 109 (goal <150), HDL 36 (goal >40), LDL 184 (goal <130).  LFTs WNL.  Patient has been on Niaspan for 1 month and is showing improvements in all areas of lipid panel.  TG are at goal.  HDL is unchanged and remains below goal.  LDL has improved but remains above goal.  Patient is intolerant of statin medications and is tolerating Niaspan well, so we will continue this regimen.  Patient still has significant room for improvement in diet and exercise.  We have set some small goals in order to further improve lifestyle.  We will work on lifestyle modification for the next two months, at which time we will reassess.  If lipid panel remains uncontrolled at this time, could consider increasing niaspan dose.    Plan: 1)  No medication changes, continue with Niaspan 500 mg daily at bedtime 2)  Limit burgers to only twice per week. 3)  Watch portion sizes and sweets intake over the holidays 4)  Attempt to walk for 20-30 min at least once per week  5)  Return for follow-up in 2 months

## 2011-08-24 ENCOUNTER — Other Ambulatory Visit (INDEPENDENT_AMBULATORY_CARE_PROVIDER_SITE_OTHER): Payer: Medicare Other | Admitting: *Deleted

## 2011-08-24 DIAGNOSIS — E7800 Pure hypercholesterolemia, unspecified: Secondary | ICD-10-CM

## 2011-08-24 LAB — HEPATIC FUNCTION PANEL
Bilirubin, Direct: 0.1 mg/dL (ref 0.0–0.3)
Total Bilirubin: 0.7 mg/dL (ref 0.3–1.2)
Total Protein: 6.8 g/dL (ref 6.0–8.3)

## 2011-08-24 LAB — LIPID PANEL
Cholesterol: 239 mg/dL — ABNORMAL HIGH (ref 0–200)
HDL: 38.1 mg/dL — ABNORMAL LOW
Total CHOL/HDL Ratio: 6
Triglycerides: 101 mg/dL (ref 0.0–149.0)
VLDL: 20.2 mg/dL (ref 0.0–40.0)

## 2011-08-24 LAB — LDL CHOLESTEROL, DIRECT: Direct LDL: 183 mg/dL

## 2011-08-27 ENCOUNTER — Ambulatory Visit (INDEPENDENT_AMBULATORY_CARE_PROVIDER_SITE_OTHER): Payer: Medicare Other | Admitting: Pharmacist

## 2011-08-27 VITALS — Wt 201.5 lb

## 2011-08-27 DIAGNOSIS — E7800 Pure hypercholesterolemia, unspecified: Secondary | ICD-10-CM

## 2011-08-27 NOTE — Patient Instructions (Addendum)
Thank you for coming into lipid clinic today.  Please continue to increase your intake for fruits, vegetables, and fiber; we suggest substituting these foods for more starchy high carbohydrate sides like potatoes.   Please increase your Niaspan to 1000 mg for 4 weeks. If you tolerate that, please increase your Niaspan to 1500 mg once daily at bedtime. If the side effects increase with this dose, you may decrease back down to 1000 mg once daily. If you experience flushing, you may try taking ibuprofen 200 mg about 30 minutes before you take your Niaspan. Please call us if you have any difficulty.   We would like to follow-up in 3 months. We will contact you to schedule your next appointment.

## 2011-08-27 NOTE — Assessment & Plan Note (Addendum)
Current lipid panel (08/24/11):  TC 239 (goal <200), TG 101 (goal <150), HDL 38 (goal>40), LDL 183 (goal <130).  LFTs are WNL.  TG, LDL and HDL have improved since last visit, but LDL remains above goal and HDL remains slightly below goal.  There is room for improvement in his diet, as well as an opportunity to increase his Niaspan.  We will slowly titrate this up to prevent increase in LFTs. Pt has an allergy to ASA so will have him try NSAID prior to niacin if flushing occurs.   Plan: 1)  Increase Niaspan to 1000 mg daily for 4 weeks, then try increasing to 1500 mg daily.  If experience flushing, pre-treat with ibuprofen 200 mg 30 minutes before Niaspan dose.  Call with any difficulties. 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

## 2011-08-27 NOTE — Progress Notes (Signed)
HPI:  Samuel Levine is a 71 yoWM presenting for dyslipidemia follow up.  He is currently treated with Niaspan 500 mg PO qHS.  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  Breakfast consists of an apple and banana, and bacon and eggs on the weekends.  Lunct consists of hamburgers a few times per week, fish once per week, and [employer] sandwiches the other days (ham or roast beef).  Dinner consists of a grilled chicken salad with 1000 island dressing twice weekly, country style steak with potatoes.  He drinks black coffee, light lemonade, and sweet tea (with lunch only most days).    Current Outpatient Prescriptions  Medication Sig Dispense Refill  . ADVAIR DISKUS 500-50 MCG/DOSE AEPB ONE PUFF BY MOUTH TWO TIMES A DAY RINSE MOUTH AFTER EACH USE  1 each  5  . amLODipine (NORVASC) 5 MG tablet Take 5 mg by mouth daily.        . diclofenac (VOLTAREN) 75 MG EC tablet Take 75 mg by mouth 2 (two) times daily.        . fexofenadine (ALLEGRA) 180 MG tablet Take 180 mg by mouth daily.        . fluticasone (VERAMYST) 27.5 MCG/SPRAY nasal spray Place 2 sprays into the nose daily.        . niacin (NIASPAN) 500 MG CR tablet Take 1 tablet (500 mg total) by mouth at bedtime.  30 tablet  6  . pantoprazole (PROTONIX) 40 MG tablet Take 40 mg by mouth daily.        . psyllium (METAMUCIL SMOOTH TEXTURE) 28 % packet Take 1 packet by mouth daily.        . VENTOLIN HFA 108 (90 BASE) MCG/ACT inhaler INHALE 2 PUFFS EVERY FOUR HOURS AS NEEDED  1 Inhaler  5   Allergies  Allergen Reactions  . Aspirin     REACTION: causes asthma flare

## 2011-09-10 ENCOUNTER — Telehealth: Payer: Self-pay | Admitting: Pulmonary Disease

## 2011-09-10 NOTE — Telephone Encounter (Signed)
Received copies from [facility],on 09/10/11. Forwarded  2pages to Dr. Olene Floss review.

## 2011-09-21 ENCOUNTER — Other Ambulatory Visit: Payer: Self-pay | Admitting: Pulmonary Disease

## 2011-09-22 ENCOUNTER — Telehealth: Payer: Self-pay | Admitting: Pulmonary Disease

## 2011-09-22 DIAGNOSIS — E7800 Pure hypercholesterolemia, unspecified: Secondary | ICD-10-CM

## 2011-09-22 MED ORDER — NIACIN ER (ANTIHYPERLIPIDEMIC) 500 MG PO TBCR: EXTENDED_RELEASE_TABLET | ORAL | Status: DC

## 2011-09-22 NOTE — Telephone Encounter (Signed)
Per SN: niaspan 500 mg 1 po QID for a 30 days supply can give #120 or for a 90 day supply #360.  I spoke with pt and advised him of sn recs. He stated he would like [facility] [place]. I have sent in rx and nothing further was needed

## 2011-09-22 NOTE — Telephone Encounter (Signed)
Called, spoke with pt.  Pt states the lipid clinic has increased the niacin.  He is currently taking niacin 500 mg 1 tablet bid.  In the middle of March, they would like him to increase it further to 2 tablets bid if he can tolerate this.  He would like a new rx for this.  [facility].  Dr. Kriste Basque, pls advise.  Thanks!

## 2011-10-27 ENCOUNTER — Telehealth: Payer: Self-pay | Admitting: Pulmonary Disease

## 2011-10-27 MED ORDER — AMLODIPINE BESYLATE 5 MG PO TABS: 5.0000 mg | ORAL_TABLET | Freq: Every day | ORAL | Status: DC

## 2011-10-27 NOTE — Telephone Encounter (Signed)
Pt needs 90 day [facility]. RX sent. Pt aware.

## 2011-12-11 ENCOUNTER — Ambulatory Visit (INDEPENDENT_AMBULATORY_CARE_PROVIDER_SITE_OTHER): Payer: Medicare Other | Admitting: *Deleted

## 2011-12-11 DIAGNOSIS — E7800 Pure hypercholesterolemia, unspecified: Secondary | ICD-10-CM

## 2011-12-11 LAB — LIPID PANEL
Cholesterol: 200 mg/dL (ref 0–200)
HDL: 34.6 mg/dL — ABNORMAL LOW (ref >39.00)
LDL Cholesterol: 142 mg/dL — ABNORMAL HIGH (ref 0–99)
Total CHOL/HDL Ratio: 6
Triglycerides: 116 mg/dL (ref 0.0–149.0)

## 2011-12-11 LAB — HEPATIC FUNCTION PANEL
Bilirubin, Direct: 0.1 mg/dL (ref 0.0–0.3)
Total Bilirubin: 1 mg/dL (ref 0.3–1.2)

## 2011-12-16 ENCOUNTER — Ambulatory Visit (INDEPENDENT_AMBULATORY_CARE_PROVIDER_SITE_OTHER): Payer: Medicare Other | Admitting: Pharmacist

## 2011-12-16 ENCOUNTER — Ambulatory Visit: Payer: Medicare Other | Admitting: Pharmacist

## 2011-12-16 VITALS — Wt 195.0 lb

## 2011-12-16 DIAGNOSIS — E7800 Pure hypercholesterolemia, unspecified: Secondary | ICD-10-CM

## 2011-12-16 NOTE — Progress Notes (Signed)
HPI   Patient presents to the clinic today for a follow-up appointment for his lipids.  He reports taking Niaspan only BID rather than QID as recommended at last visit.  Increased the dose from BID to QID and experienced a tingling sensation for about 1 hour in the evening.  This tingling feeling did not make him feel right so he went back to the BID dosing.  He did not report trying taking medication TID or has not tried to premedicate with ibuprofen (aspirin not an option d/t allergy).   Diet: Patient feels that he is improving his diet slightly but reducing the amount of cheeses and hamburgers and eating more chicken.  Overall, his diet is still relatively poor and could use improvement.   Breakfast: Has a ham and egg biscuit from a fast food restaurant about 3 out of 5 days.  On the weekends he gets a plate of eggs and bacon.  Reports that he has been trying to eat more apples, bananas and grapes.   Lunch: Reports getting something like meatloaf or fried chicken with a side of creamed potatoes or french fries.  Drinks about 2-3 glasses of sweet tea with lunch.   Dinner: Generally brings something home like hamburger steak with slaw or stew beef with a baked potato and lots of butter.   Does not report snaking much and will have sugar free ice cream or Klondike bar at night.   Exercise: Reports most exercise coming from his job.  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  Jobs requires a lot of walking.  Will also do yard work at the house.   Current Outpatient Prescriptions  Medication Sig Dispense Refill  . amLODipine (NORVASC) 5 MG tablet Take 1 tablet (5 mg total) by mouth daily.  90 tablet  1  . Fluticasone-Salmeterol (ADVAIR DISKUS) 500-50 MCG/DOSE AEPB       . guaiFENesin (MUCINEX) 600 MG 12 hr tablet Take 1,200 mg by mouth daily.      . niacin (NIASPAN) 500 MG CR tablet Take 500 mg by mouth 2 (two) times daily.      . pantoprazole (PROTONIX) 40 MG tablet       .  psyllium (METAMUCIL SMOOTH TEXTURE) 28 % packet Take 1 packet by mouth daily as needed.       . VENTOLIN HFA 108 (90 BASE) MCG/ACT inhaler INHALE 2 PUFFS EVERY FOUR HOURS AS NEEDED  1 Inhaler  5  . DISCONTD: ADVAIR DISKUS 500-50 MCG/DOSE AEPB ONE PUFF BY MOUTH TWO TIMES A DAY RINSE MOUTH AFTER EACH USE  1 each  5  . DISCONTD: pantoprazole (PROTONIX) 40 MG tablet TAKE 1 TABLET TWICE A DAY  180 tablet  2  . DISCONTD: niacin (NIASPAN) 500 MG CR tablet Take 1 tablet 4 times a day  360 tablet  0    Allergies  Allergen Reactions  . Aspirin     REACTION: causes asthma flare

## 2011-12-16 NOTE — Assessment & Plan Note (Addendum)
TC 200 (goal<200), TG 116 (goal<150), HDL 35 (goal>40) and LDL 142 (goal<130).   LFTs are WNL.   Patient seems to have responded well from increasing Niaspan from daily to BID.  Patient will likely benefit from increased Niaspan if tolerated and improvement in diet.    1. Increase Niaspan to TID.  Instructed patient to call clinic if it is not tolerated.  Encouraged use of ibuprofen as premedication.  2. Discussed improvements that can be made in diet like increasing fruits, trying vegetables, preference towards creamed potatoes over french fries, limiting the amount of eggs eaten and choosing baked/grilled chicken over fried.  Suggested trying to reduce the amount of sweet tea with meals.   3. F/U lipid appointment in 3 months.

## 2011-12-16 NOTE — Patient Instructions (Addendum)
Try to increase Niaspan to 500mg  three times per day.  Consider taking ibuprofen before the Niacin dose.    Try to reduce the amount of eggs you are eating, especially if they have egg yolks.  Try to eat more fruits and vegetables with your meals.  When eating chicken, try to eat more baked or grilled chicken rather than fried.  Work on reducing the amount of sweet tea that you drink.   Recheck lipids in 3 months.

## 2012-01-16 ENCOUNTER — Other Ambulatory Visit: Payer: Self-pay | Admitting: Pulmonary Disease

## 2012-01-20 ENCOUNTER — Other Ambulatory Visit: Payer: Self-pay | Admitting: Pulmonary Disease

## 2012-03-09 ENCOUNTER — Other Ambulatory Visit (INDEPENDENT_AMBULATORY_CARE_PROVIDER_SITE_OTHER): Payer: Medicare Other

## 2012-03-09 DIAGNOSIS — E7800 Pure hypercholesterolemia, unspecified: Secondary | ICD-10-CM

## 2012-03-09 LAB — HEPATIC FUNCTION PANEL
Alkaline Phosphatase: 118 U/L — ABNORMAL HIGH (ref 39–117)
Bilirubin, Direct: 0.2 mg/dL (ref 0.0–0.3)
Total Protein: 6.1 g/dL (ref 6.0–8.3)

## 2012-03-09 LAB — LIPID PANEL
HDL: 38 mg/dL — ABNORMAL LOW (ref >39.00)
Total CHOL/HDL Ratio: 4
VLDL: 12.8 mg/dL (ref 0.0–40.0)

## 2012-03-16 ENCOUNTER — Ambulatory Visit (INDEPENDENT_AMBULATORY_CARE_PROVIDER_SITE_OTHER): Payer: Medicare Other | Admitting: Pharmacist

## 2012-03-16 VITALS — BP 118/76 | Wt 186.0 lb

## 2012-03-16 DIAGNOSIS — E7800 Pure hypercholesterolemia, unspecified: Secondary | ICD-10-CM

## 2012-03-16 NOTE — Assessment & Plan Note (Signed)
Assessment: LDL at goal (<130 mg/dl).  LFTs elevated since increasing Niaspan to 4x/day.  Pt's diet has improved, but there is still room for more improvement.   Plan: Reduce Niaspan 500 mg to TID to try to bring LFTs back down to normal.   Recommended healthier food choices for breakfast and lunch to try to compensate for the reduced dose.   Recheck in 6 weeks (at Dr. Kriste Basque appt) for LFT improvement.

## 2012-03-16 NOTE — Patient Instructions (Addendum)
Reduce Niaspan to 500 mg 3 times each day.    Try to make small improvements in diet such as grilled chicken for lunch rather than fried.   Recheck labs in 6 weeks with Dr. Kriste Basque.

## 2012-03-16 NOTE — Progress Notes (Signed)
Samuel Levine is a 71 y/o male at lipid clinic for follow up of his hypercholesterolemia.  He has increased his Niaspan 500 mg since last visit to 4x/day for the last 2 months.  He report some pins and needles, burning feeling on his head after taking the 4th dose, even with pre-treating with ibuprofen.   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  He reports having lost some weight recently, mainly by reducing portion sizes.    Current Outpatient Prescriptions  Medication Sig Dispense Refill  . amLODipine (NORVASC) 5 MG tablet Take 1 tablet (5 mg total) by mouth daily.  90 tablet  1  . Fluticasone-Salmeterol (ADVAIR DISKUS) 500-50 MCG/DOSE AEPB Take 1 puff by mouth daily.       Marland Kitchen guaiFENesin (MUCINEX) 600 MG 12 hr tablet Take 1,200 mg by mouth daily.      . niacin (NIASPAN) 500 MG CR tablet Take 500 mg by mouth 3 (three) times daily.       . pantoprazole (PROTONIX) 40 MG tablet Take 40 mg by mouth daily.       . psyllium (METAMUCIL SMOOTH TEXTURE) 28 % packet Take 1 packet by mouth daily as needed.       . VENTOLIN HFA 108 (90 BASE) MCG/ACT inhaler INHALE 2 PUFFS EVERY FOUR HOURS AS NEEDED  1 Inhaler  5  . DISCONTD: ADVAIR DISKUS 500-50 MCG/DOSE AEPB INHALE ONE PUFF TWICE DAILY THEN RINSE MOUTH AFTER EACH USE  60 each  3  . DISCONTD: Fluticasone-Salmeterol (ADVAIR DISKUS) 500-50 MCG/DOSE AEPB       . DISCONTD: niacin (NIASPAN) 500 MG CR tablet Take 500 mg by mouth 2 (two) times daily.      Marland Kitchen DISCONTD: NIASPAN 500 MG CR tablet TAKE 1 TABLET 4  TIMES A DAY  120 tablet  0     Allergies  Allergen Reactions  . Aspirin     REACTION: causes asthma flare

## 2012-04-14 ENCOUNTER — Other Ambulatory Visit: Payer: Self-pay | Admitting: Pulmonary Disease

## 2012-05-03 ENCOUNTER — Encounter: Payer: Self-pay | Admitting: Pulmonary Disease

## 2012-05-03 ENCOUNTER — Other Ambulatory Visit (INDEPENDENT_AMBULATORY_CARE_PROVIDER_SITE_OTHER): Payer: Medicare Other

## 2012-05-03 ENCOUNTER — Ambulatory Visit (INDEPENDENT_AMBULATORY_CARE_PROVIDER_SITE_OTHER)
Admission: RE | Admit: 2012-05-03 | Discharge: 2012-05-03 | Disposition: A | Discharge status: Home / Self Care | Payer: Medicare Other | Source: Ambulatory Visit | Attending: Pulmonary Disease | Admitting: Pulmonary Disease

## 2012-05-03 ENCOUNTER — Ambulatory Visit (INDEPENDENT_AMBULATORY_CARE_PROVIDER_SITE_OTHER): Payer: Medicare Other | Admitting: Pulmonary Disease

## 2012-05-03 VITALS — BP 132/96 | HR 62 | Temp 97.4°F | Ht 70.0 in | Wt 187.2 lb

## 2012-05-03 DIAGNOSIS — R002 Palpitations: Secondary | ICD-10-CM

## 2012-05-03 DIAGNOSIS — E7800 Pure hypercholesterolemia, unspecified: Secondary | ICD-10-CM

## 2012-05-03 DIAGNOSIS — K5730 Diverticulosis of large intestine without perforation or abscess without bleeding: Secondary | ICD-10-CM

## 2012-05-03 DIAGNOSIS — J984 Other disorders of lung: Secondary | ICD-10-CM

## 2012-05-03 DIAGNOSIS — IMO0002 Reserved for concepts with insufficient information to code with codable children: Secondary | ICD-10-CM

## 2012-05-03 DIAGNOSIS — I059 Rheumatic mitral valve disease, unspecified: Secondary | ICD-10-CM

## 2012-05-03 DIAGNOSIS — J209 Acute bronchitis, unspecified: Secondary | ICD-10-CM

## 2012-05-03 DIAGNOSIS — F411 Generalized anxiety disorder: Secondary | ICD-10-CM

## 2012-05-03 DIAGNOSIS — K449 Diaphragmatic hernia without obstruction or gangrene: Secondary | ICD-10-CM

## 2012-05-03 DIAGNOSIS — M545 Low back pain: Secondary | ICD-10-CM

## 2012-05-03 DIAGNOSIS — M542 Cervicalgia: Secondary | ICD-10-CM

## 2012-05-03 DIAGNOSIS — C61 Malignant neoplasm of prostate: Secondary | ICD-10-CM

## 2012-05-03 DIAGNOSIS — K219 Gastro-esophageal reflux disease without esophagitis: Secondary | ICD-10-CM

## 2012-05-03 LAB — CBC WITH DIFFERENTIAL/PLATELET
Basophils Relative: 0.3 % (ref 0.0–3.0)
Eosinophils Absolute: 0.4 10*3/uL (ref 0.0–0.7)
Eosinophils Relative: 6.4 % — ABNORMAL HIGH (ref 0.0–5.0)
HCT: 45.7 % (ref 39.0–52.0)
Hemoglobin: 15.2 g/dL (ref 13.0–17.0)
Lymphs Abs: 1.1 10*3/uL (ref 0.7–4.0)
MCHC: 33.3 g/dL (ref 30.0–36.0)
MCV: 97.8 fl (ref 78.0–100.0)
Monocytes Absolute: 0.5 10*3/uL (ref 0.1–1.0)
Neutro Abs: 4.2 10*3/uL (ref 1.4–7.7)
Neutrophils Relative %: 67.8 % (ref 43.0–77.0)
RBC: 4.67 Mil/uL (ref 4.22–5.81)
WBC: 6.2 10*3/uL (ref 4.5–10.5)

## 2012-05-03 LAB — BASIC METABOLIC PANEL
BUN: 12 mg/dL (ref 6–23)
Calcium: 9 mg/dL (ref 8.4–10.5)
Chloride: 105 mEq/L (ref 96–112)
Creatinine, Ser: 0.9 mg/dL (ref 0.4–1.5)
GFR: 86.31 mL/min (ref >60.00)

## 2012-05-03 LAB — HEPATIC FUNCTION PANEL
Bilirubin, Direct: 0.2 mg/dL (ref 0.0–0.3)
Total Bilirubin: 1.1 mg/dL (ref 0.3–1.2)

## 2012-05-03 MED ORDER — ALBUTEROL SULFATE HFA 108 (90 BASE) MCG/ACT IN AERS: 2.0000 | INHALATION_SPRAY | RESPIRATORY_TRACT | Status: AC | PRN

## 2012-05-03 MED ORDER — PANTOPRAZOLE SODIUM 40 MG PO TBEC: 40.0000 mg | DELAYED_RELEASE_TABLET | Freq: Every day | ORAL | Status: DC

## 2012-05-03 MED ORDER — PREDNISONE 20 MG PO TABS: ORAL_TABLET | ORAL | Status: DC

## 2012-05-03 MED ORDER — FLUTICASONE-SALMETEROL 500-50 MCG/DOSE IN AEPB: 1.0000 | INHALATION_SPRAY | Freq: Two times a day (BID) | RESPIRATORY_TRACT | Status: DC

## 2012-05-03 MED ORDER — AZITHROMYCIN 250 MG PO TABS: ORAL_TABLET | ORAL | Status: DC

## 2012-05-03 MED ORDER — AMLODIPINE BESYLATE 5 MG PO TABS: 5.0000 mg | ORAL_TABLET | Freq: Every day | ORAL | Status: DC

## 2012-05-03 MED ORDER — NIACIN ER (ANTIHYPERLIPIDEMIC) 500 MG PO TBCR: 500.0000 mg | EXTENDED_RELEASE_TABLET | Freq: Three times a day (TID) | ORAL | Status: DC

## 2012-05-03 MED ORDER — METHYLPREDNISOLONE ACETATE 80 MG/ML IJ SUSP
80.0000 mg | Freq: Once | INTRAMUSCULAR | Status: AC
  Administered 2012-05-03: 80 mg via INTRAMUSCULAR

## 2012-05-03 NOTE — Patient Instructions (Addendum)
Today we updated your med list in our EPIC system...    Continue your current medications the same...    We refilled your meds per request...  Today we did your follow up CXR, EKG, & Blood work...    If you will active "My Chart" we will communicate your results via the computer...  For your upper resp infection>>    We will Rx w/ an antibiotic w/ good penetration into the upper resp tissues> Zithromax- take as directed...    And we will reduce the inflammation w/ a Depo shot & short course of PREDNISONE:       Take one tab twice daily for 3d, then one tab daily for 3d, then 1/2 tab daily til gone...  Call for any problems.Marland KitchenMarland Kitchen

## 2012-05-03 NOTE — Progress Notes (Signed)
Subjective:    Patient ID: Samuel Levine, male    DOB: 06-Jan-1941, 71 y.o.   MRN: [id number]  HPI 71 y/o WM here for a follow up visit... he has multiple medical problems as noted below...    ~  states he was doing fine until Dec09 w/ onset of left shoulder pain- treated self w/ Advil/ Tylenol/ Icey Hot & improved after 2-3 weeks... recurrence of pain towards the end of Feb10 & went to an EMT friend who did EKG "for precautionary reasons" & it was OK, but BP was 168/102 (BP's at home usually 130-140's/ 70-80's)... he went to Urgent Care "to be safe & get blood work" they did XRay of chest & shoulder w/ ?of widening of mediastinum- sent to ER where BP was 170/110 initially and grad ret to 130/80 range... eval included CTChest/ Abd/ Pelvis= NEG w/ mult incidental findings- right base atelec, 6mm RLL nodule, right adrenal nodule, cysts in kidneys/ liver, DDD in lumbar sp... XRay CSpine= degen changes, bilat foraminal stenoses, facet arthropathy, anterolisthesis C5 & C6... EKG was neg x PVC, and cardiac enz neg... labs were normal... he was told to f/u w/ [place] for "mass in lung" and DrBerry for "further cardiac testing"- no meds given... he saw DrGamble 3/10 w/ labs done & set up for Myoview...  ~  Mar10:  we discussed the above eval & focused on 3 things: 1)  Neck & shoulder pain- we will start anti-inflamm pain meds, heat, etc; and refer to Neurosurg; 2)  Cardiac eval- already arranged by DrGamble & Amlodipine 5mg  started... pt will be reassured by these tests; 3)  6mm RLL lung nodule- pt is a non-smoker, hx of asthma, no chest symptoms- f/u CXR planned...  ~  April 17, 2010:  37mo ROV- he saw DrBerry 3/10 w/ 2DEcho (normal, x mild AI) & Myoview (neg- no ischemia, EF= 63%)... he had a colonoscopy by DrPatterson 4/10- mild divertics, sm nodule in sigmid, bx= tubular adenoma & f/u planned 12yrs...  he saw DrGrapey for Urology 8/11 for f/u prostate cancer s/p prostatectomy & then had adjuvant XRT for rising PSA  post surg & is followed every 4-75mo w/ continued slow rise in PSA to 0.8- 8/11(watchful waiting protocol before considering hormonal therapy)...  he feels his Asthma is under good control;  CXR today for f/u 6mm nodule-  no change;  BP undr good conrol, denies CP, etc;  Chol is poorly controlled on diet alone- but he is intol to all statins etc;  otherw stable w/o new complaints or concerns he says.  ~  May 04, 2011:  Yearly ROV & [name] reports several problems this yr>  He had 1st ever kidney stone event (2mm stone passed spont per DrGrapey); he continues f/u of his recurrent prostate cancer every 61mo from Urology w/ slow rise in his PSA up to 1.47 in May, doubling time ~6-51months, per DrGrapey the plan is to continue surveillance & intervene w/ hormone therapy when PSA approaches 10...  [name] also had some incr allergy manifestations w/ several episodes of lip tingling & swelling- he treated self w/ Benedryl w/ resolution; pt not on ACE med; on careful questioning it seems he restarted ASA 81mg  despite his hx ASA sensitivity & Triad Asthma (thankfully no severe reaction)> I told him the ASA was prob the culprit & to stop this med immediately, use Tylenol for pain, etc...  Due for CXR (NAD, ? nodule- check CT for completeness) & Fasting labs today (FLP looks terrible &  [facility] has him on diet alone, intol to all statins), ok Flu shot, he wants Depo as well...  ~  May 03, 2012:  Yearly ROV & [name] has had a good year> He notes recent URI w/ congestion, drainage, cough & sm amt thick beige sput; he is taking Zyrtek, Advair500, & Ventolin HFA as needed, so we decided to RX w/ ZPak, Depo80, Pred- 3d taper, & Mucinex...  He has been followed by the [facility] for his Chol & started on NIASPAN- grad incr to 3-4 daily w/ improvement in his FLP but they noted incr LFTs when the dose went to 4/d; he has since backed off to 3/d & is to f/u in Lipid clinic soon; we rechecked his routine labs today and note his LFTs  are improved w/ decr in the Niaspan dose as below...    AR/ AB> as above, he is on Zyrtek10, Advair500, & Ventolin HFA; URI as above...    Pulm Nodules> see prev CTChest 10/12- 5mm RLL nodule w/o ch from 2010; additional 3mm RLL nodule seen, linear scarring R base, no adenopathy...    MVP/ mild AI> on Norvasc5; BP= 132/96 & he is reminded to elim sodium, & get wt down...    Chol> back on Niaspan 3/d; FLP 8/13 on 4/d showed TChol 157, TG 64, HDL 38, LDL 104 but LFTs are up Sgot=55 Sgpt=116 & therefore decr back to 3/d...    HH/ GERD> on Protonix40; he denies abd pain, n/v, dysphagia, etc...    Divertics/ Polyps> on Metamucil daily; doing satis w regular BMs, no d/c/ blood...    Kidney stones/ PROSTATE CANCER> seen by DrGrapey 5/13- PSA up to 5.9 w/ doubling time of 31mo; they plan restaging & Rx w/ hormone therapy when PSA~10.    DDD/ Neck pain/ LBP> on OTC meds as needed...    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 We reviewed prob list, meds, xrays and labs> see below for updates >> Hold on Flu shot til after the acute AB rx... CXR 10/13 showed normal heart size, clear lungs, no nodules seen... EKG 10/13 showed NSR, rate61, rsr' in V1, otherw wnl... LABS 10/13:  Chems- wnl;  CBC- wnl;  TSH=1.68...          Problem List:   ALLERGIC RHINITIS (ICD-477.9) & Hx of SINUSITIS, RECURRENT (ICD-473.9) - he uses OTC antihistamines Prn... he has a long hx of allergies w/ pos testing to dust mostly... he had nasal polyp surg in 1985... he is ASPIRIN sensitive ([facility]ad Asthma)... he had additional sinus surgery by DrLawrence in 1997... ~  9/11:  we discussed chr sinus regimen w/ Antihist in AM, Saline nasal pray, & FLONASE Qhs. ~  10/12:  He restarted ASA on his own & has had some lip swelling> pt told to stop ASA immed & ok to Rx w/ Benedryl...  ASTHMA (ICD-493.90) & Hx of ASTHMATIC BRONCHITIS, ACUTE (ICD-466.0) - on ADVAIR 500Bid & VENTOLIN Prn w/ good control...  prev on Singulair as well but he stopped this on his own.  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  repeat CXR 9/11 showed clear lungs, norm heart size, no nodule seen... ~  10/12:  CXR shows NAD; radiology queries 8mm RLL nodule & they suggest f/u CT to eval... ~  10/12:  CT Chest showed 5mm RLL nodule w/o ch from 2010; additional 3mm  RLL nodule seen, linear scarring R base, no adenopathy... ~  10/13:  CXR showed normal heart size, clear lungs, NAD...  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 he had a negative cath in 1992- no coronary disease, min MVP seen... he has a hx of MVP and occas ectopy w/ incr BP response to exercise... given low dose Atenolol in past... 2DEcho and Cardiolite in 2005 were both reported normal by DrBerry... ~  3/10:  repeat cardiac eval by [facility] w/ 2DEcho norm x mild AI, & neg Myoview- no ischemia, EF=63%... started AMLODIPINE 5mg /d. ~  EKG 10/13 showed NSR, rate61, rsr' in V1, NAD...  HYPERCHOLESTEROLEMIA (ICD-272.0) - eval and management by DrBerry for [facility]... pt reports being tried on numerous Statins and Zetia & states INTOL to all these meds... in 2008 he stopped Zocor40 due to aching and started Grapeseed extract made by his cousin in [facility] (he reports that this caused bone pain as well)... ~  FLP here 7/08 showed TChol 193, TG 73, HDL 30, LDL 148... rec- incr Simva20 to 40mg /d (Intol & stopped by pt). ~  He reports interim FLPs by [facility]... ~  FLP here 9/11 showed TChol 235, TG 153, HDL 32, LDL 184... needs better diet & exercise program. ~  FLP here 10/12 on diet alone showed TChol 259, TG 155, HDL 37, LDL 196... Reports INTOL to ALL meds, he at least needs a trial in the Lipid[facility]inic.  HIATAL HERNIA (ICD-553.3) & GERD (ICD-530.81) - on OMEPRAZOLE  40mg /d... ~  he had a 24H pH probe in 1991 which was unremarkable... ~  EGD 4/07 by DrPatterson showed 8cmHH, cameron erosions, chr GERD, & gastritis... HPylori was neg.  DIVERTICULOSIS, COLON (ICD-562.10) & COLONIC POLYPS (ICD-211.3) - he had a lower GI Bleed & was hosp at [facility] in [place] 2/07- required 6u blood, did not require surgery... he takes METAMUCIL daily... ~  colonoscopy 4/07 by DrPatterson showed divertics, 6mm polyp in asc colon= adenomatous w/ f/u planned for 53yrs... ~  CT Abd/ Pelvis Feb10= NEG w/ mult incidental findings- right base atelec, 6mm RLL nodule, right adrenal nodule, cysts in kidneys/ liver, DDD in lumbar sp... ~  colonoscopy 4/10 by DrPatterson showed divertics & nodule in sigmoid= tubular adenoma on bx; f/u planned 29yrs.  NEPHROLITHIASIS >> first & only kidney stone 5/12 ~86mm & he passed it, no other stones seen... Hx of PROSTATE CANCER (ICD-185) - found to have right postate nodule on DRE 1/08 during his last CPX... PSA= 4.87... referred to Urology w/ pos biopies and robotic prostatectomy 4/08 by DrGrapey- he had capsular extension but neg margins etc... subseq PSA's were nondetectable, but then started to rise again and was 0.22 in Jul09- therefore sent for adjuvant XRT which he tolerated well w/ decr in the PSA... pt still followed by DrGrapey every 4-6 months due to continued slow rise in PSA post XRT... ~  5/12:  Seen by DrGrapey for f/u of his recurrent prostate cancer (every 65mo) w/ slow rise in his PSA up to 1.47 in [id number], doubling time ~6-63months, per DrGrapey the plan is to continue surveillance & intervene w/ hormone therapy when PSA approaches 10...  DEGENERATIVE DISC DISEASE (ICD-722.6) - on VOLTAREN 75mg Bid Prn (he apparently tolerates NSAIDs OK despite his hx Triad Asthma & ASA sensitivity)...  NECK PAIN (ICD-723.1) & SHOULDER PAIN (ICD-719.41) ~  XRays Feb10 from ER showed  degen changes, bilat foraminal stenoses, facet arthropathy,  anterolisthesis C5 & C6.  Hx of BACK PAIN, LUMBAR (ICD-724.2) - he had 2 lumbar laminectomies in the past by DrDeaton...   ANXIETY (ICD-300.00)   Past Surgical History  Procedure Date  . Tonsillectomy   . Lumbar laminectomy   . Achilles tendon repair   . Prostatectomy     Outpatient Encounter Prescriptions as of 05/03/2012  Medication Sig Dispense Refill  . amLODipine (NORVASC) 5 MG tablet TAKE 1 TABLET BY MOUTH EVERY DAY  90 tablet  0  . Fluticasone-Salmeterol (ADVAIR DISKUS) 500-50 MCG/DOSE AEPB Take 1 puff by mouth daily.       Marland Kitchen guaiFENesin (MUCINEX) 600 MG 12 hr tablet Take 1,200 mg by mouth daily.      . niacin (NIASPAN) 500 MG CR tablet Take 500 mg by mouth 3 (three) times daily.       Marland Kitchen NIASPAN 500 MG CR tablet TAKE 1 TABLET BY MOUTH FOUR TIMES DAILY.  120 tablet  0  . pantoprazole (PROTONIX) 40 MG tablet Take 40 mg by mouth daily.       . psyllium (METAMUCIL SMOOTH TEXTURE) 28 % packet Take 1 packet by mouth daily as needed.       . VENTOLIN HFA 108 (90 BASE) MCG/ACT inhaler INHALE 2 PUFFS EVERY FOUR HOURS AS NEEDED  1 Inhaler  5    Allergies  Allergen Reactions  . Aspirin     REACTION: causes asthma flare    Current Medications, Allergies, Past Medical History, Past Surgical History, Family History, and Social History were reviewed in [employer] record.    Review of Systems         The patient complains of nasal congestion, constipation, gas/bloating, stiffness, arthritis, and hay fever.  The patient denies fever, chills, sweats, anorexia, fatigue, weakness, malaise, weight loss, sleep disorder, blurring, diplopia, eye irritation, eye discharge, vision loss, eye pain, photophobia, earache, ear discharge, tinnitus, decreased hearing, nosebleeds, sore throat, hoarseness, chest pain, palpitations, syncope, dyspnea on exertion, orthopnea, PND, peripheral edema, cough, dyspnea at rest, excessive sputum, hemoptysis, wheezing, pleurisy, nausea,  vomiting, diarrhea, change in bowel habits, abdominal pain, melena, hematochezia, jaundice, indigestion/heartburn, dysphagia, odynophagia, dysuria, hematuria, urinary frequency, urinary hesitancy, nocturia, incontinence, back pain, joint pain, joint swelling, muscle cramps, muscle weakness, sciatica, restless legs, leg pain at night, leg pain with exertion, rash, itching, dryness, suspicious lesions, paralysis, paresthesias, seizures, tremors, vertigo, transient blindness, frequent falls, frequent headaches, difficulty walking, depression, anxiety, memory loss, confusion, cold intolerance, heat intolerance, polydipsia, polyphagia, polyuria, unusual weight change, abnormal bruising, bleeding, enlarged lymph nodes, urticaria, allergic rash, and recurrent infections.     Objective:   Physical Exam     WD, WN, 72 y/o WM in NAD...  GENERAL:  Alert & oriented; pleasant & cooperative... HEENT:  [place]/AT, EOM-wnl, PERRLA, EACs-clear, TMs-wnl, NOSE-congested, THROAT-clear & wnl. NECK:  Supple w/ decrROM; no JVD; normal carotid impulses w/o bruits; no thyromegaly or nodules palpated; no lymphadenopathy. CHEST:  Clear to P & A;  min scat rhonchi without wheezing/ rales/ consolidation... HEART:  Regular Rhythm; without murmurs/ rubs/ or gallops detected... ABDOMEN:  Soft & nontender; normal bowel sounds; no organomegaly or masses palpated... EXT: without deformities, mod arthritic changes; no varicose veins/ venous insuffic/ or edema. BACK:  +tender spots/ trigger points... decr ROM spine... NEURO:  CN's intact;  no focal neuro deficits... DERM:  No lesions noted; no rash etc...  RADIOLOGY DATA:  Reviewed in the EPIC EMR & discussed w/  the patient...  LABORATORY DATA:  Reviewed in the EPIC EMR & discussed w/ the patient...   Assessment & Plan:    AR & Asthma>  He needs to STOP the ASA; continue Advair regularly + Ventolin rescue; use MUCINEX, Benedryl as needed...  PULM NODULE>  Hx tiny 6mm periph RLL  nodule seen on CT 2/10 (done in ER, incidental finding) & not prev seen on CXR; radiologist queries 8mm nodule on 10/12 film & suggests f/u CT ==> no change in 5mm RLL nodule & ?new40mm RLL lesion;  CXR 10/13 w/o nodules seen...  CARDS> HxCP, mildMVP, mildAI>  Followed by [facility], DrBerry (?when last visit); on Amlodipine5mg  & stable- denies CP, palpit, dizzy, SOB, etc; he will call for ROV...  CHOL>  He has been INTOL to all statins & he tells me [facility] said just to stay on diet despite his poor numbers; LDL tosay is 196 & I feel he deserves a trial in the Lipid Clinic if he will go...  LARGE HH/ GERD>  8cmHH mon EGD by [facility], pt on Protonix Bid; continue same med...  Divertics/ Colon Polyps>  Hx lower GIB in 2007, he uses Metamucil daily, last colon 4/10 w/ adenoma & f/u planned 77yrs...  Prostate Ca/ [facility]>  Followed by DrGrapey, s/p surg & XRT, on watchful waiting w/ slow rising PSA...  DJD, Neck pain, Shoulder pain, LBP>  On Voltaren prn...  Anxiety>  He copes very well & does not require anxiolytic RX...   Patient's Medications  New Prescriptions   AZITHROMYCIN (ZITHROMAX) 250 MG TABLET    Take as directed   PREDNISONE (DELTASONE) 20 MG TABLET    Take 1 tab two times daily x 3 days, 1 daily x 3 days, 1/2 tab daily till gone  Previous Medications   CETIRIZINE (ZYRTEC) 10 MG TABLET    Take 10 mg by mouth daily.   GUAIFENESIN (MUCINEX) 600 MG 12 HR TABLET    Take 1,200 mg by mouth daily.   PSYLLIUM (METAMUCIL SMOOTH TEXTURE) 28 % PACKET    Take 1 packet by mouth daily as needed.   Modified Medications   Modified Medication Previous Medication   ALBUTEROL (VENTOLIN HFA) 108 (90 BASE) MCG/ACT INHALER VENTOLIN HFA 108 (90 BASE) MCG/ACT inhaler      Inhale 2 puffs into the lungs every 4 (four) hours as needed for wheezing.    INHALE 2 PUFFS EVERY FOUR HOURS AS NEEDED   AMLODIPINE (NORVASC) 5 MG TABLET amLODipine (NORVASC) 5 MG tablet      Take 1 tablet (5 mg total) by mouth daily.     TAKE 1 TABLET BY MOUTH EVERY DAY   FLUTICASONE-SALMETEROL (ADVAIR DISKUS) 500-50 MCG/DOSE AEPB Fluticasone-Salmeterol (ADVAIR DISKUS) 500-50 MCG/DOSE AEPB      Inhale 1 puff into the lungs 2 (two) times daily.    Take 1 puff by mouth daily.    NIACIN (NIASPAN) 500 MG CR TABLET niacin (NIASPAN) 500 MG CR tablet      Take 1 tablet (500 mg total) by mouth 3 (three) times daily.    Take 500 mg by mouth 3 (three) times daily.    PANTOPRAZOLE (PROTONIX) 40 MG TABLET pantoprazole (PROTONIX) 40 MG tablet      Take 1 tablet (40 mg total) by mouth daily.    Take 40 mg by mouth daily.   Discontinued Medications   NIASPAN 500 MG CR TABLET    TAKE 1 TABLET BY MOUTH FOUR TIMES DAILY.

## 2012-05-05 ENCOUNTER — Other Ambulatory Visit (HOSPITAL_COMMUNITY): Payer: Self-pay | Admitting: Urology

## 2012-05-05 DIAGNOSIS — C61 Malignant neoplasm of prostate: Secondary | ICD-10-CM

## 2012-05-08 ENCOUNTER — Encounter: Payer: Self-pay | Admitting: Pulmonary Disease

## 2012-05-09 ENCOUNTER — Telehealth: Payer: Self-pay | Admitting: Pulmonary Disease

## 2012-05-09 NOTE — Telephone Encounter (Signed)
Called and spoke with pt and he stated that he had a call from the [place] office and was confused since they told him to call back for his lab results.  Pt stated that he had already gotten these results on 10-03.  i explained to the pt that i did not see any other calls to the pt in his chart.  He is aware of his appt with Lipid clinic in the am.  Nothing further needed from the pt.

## 2012-05-10 ENCOUNTER — Ambulatory Visit (INDEPENDENT_AMBULATORY_CARE_PROVIDER_SITE_OTHER): Payer: Medicare Other | Admitting: Pharmacist

## 2012-05-10 DIAGNOSIS — E7800 Pure hypercholesterolemia, unspecified: Secondary | ICD-10-CM

## 2012-05-10 DIAGNOSIS — R0989 Other specified symptoms and signs involving the circulatory and respiratory systems: Secondary | ICD-10-CM

## 2012-05-10 DIAGNOSIS — E785 Hyperlipidemia, unspecified: Secondary | ICD-10-CM

## 2012-05-10 LAB — LIPID PANEL
Cholesterol: 207 mg/dL — ABNORMAL HIGH (ref 0–200)
Total CHOL/HDL Ratio: 5
VLDL: 18.8 mg/dL (ref 0.0–40.0)

## 2012-05-10 LAB — LDL CHOLESTEROL, DIRECT: Direct LDL: 141.3 mg/dL

## 2012-05-13 ENCOUNTER — Encounter (HOSPITAL_COMMUNITY)
Admission: RE | Admit: 2012-05-13 | Discharge: 2012-05-13 | Disposition: A | Discharge status: Home / Self Care | Payer: Medicare Other | Source: Ambulatory Visit | Attending: Urology | Admitting: Urology

## 2012-05-13 DIAGNOSIS — C61 Malignant neoplasm of prostate: Secondary | ICD-10-CM

## 2012-05-13 MED ORDER — TECHNETIUM TC 99M MEDRONATE IV KIT
24.0000 | PACK | Freq: Once | INTRAVENOUS | Status: AC | PRN
  Administered 2012-05-13: 24 via INTRAVENOUS

## 2012-05-16 MED ORDER — FENOFIBRATE 48 MG PO TABS: 48.0000 mg | ORAL_TABLET | Freq: Every day | ORAL | Status: DC

## 2012-05-17 NOTE — Patient Instructions (Signed)
Cut Niacin down to 1000mg  daily to help with flushing.  Start fenofibrate 48mg  once daily.  Follow up in 3 months.

## 2012-05-17 NOTE — Assessment & Plan Note (Signed)
Followed up with patient over phone on 10/14.  Labs from last week revealed TC- 207, TG- 94, HDL- 43, and LDL- 141, which was an increase from last visit.  LFTs did improve with decrease dose of niacin.  Since the visit last week, he tried decreasing Niacin to 2 tablets and noticed the flushing went away.  To help minimize side effects, will stay with 1000mg  of niacin rather than 1500mg  or 2000mg .  To help decrease LDL back to goal, will add low dose fenofibrate to niacin.  Will follow up with patient in 3 months to recheck labs.

## 2012-05-17 NOTE — Progress Notes (Signed)
Mr. Thorndyke is a 71 y/o male at lipid clinic for follow up of his hypercholesterolemia.  After last visit, we decreased his niacin to 1500mg  daily due to increased LFTs.  He has been consistent with taking this except he did stop for the week prior to his labwork to recheck LFTs with Dr. Kriste Basque.  He continues to have tingling and flushing with the 3 niacin despite pretreatment with ibuprofen (he is allergic to aspirin)  His diet typically consists of the following: Breakfast: bacon and eggs with fruit (1-2x/week), porkchop biscuit and sweet tea sometimes, sometimes skips Lunch: usually skips if he eats breakfast, sometimes [employer] chicken tenders Dinner: grilled chicken salad (few times/week), stewed beef and creamed potatoes  He has tried to increase his grilled chicken and cut down on sodas since last visit.   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  He reports having lost some weight recently, mainly by reducing portion sizes.  He has started to notice a change in his pants size since the last visit.   Current Outpatient Prescriptions  Medication Sig Dispense Refill  . albuterol (VENTOLIN HFA) 108 (90 BASE) MCG/ACT inhaler Inhale 2 puffs into the lungs every 4 (four) hours as needed for wheezing.  1 Inhaler  5  . amLODipine (NORVASC) 5 MG tablet Take 1 tablet (5 mg total) by mouth daily.  90 tablet  3  . azithromycin (ZITHROMAX) 250 MG tablet Take as directed  6 each  0  . cetirizine (ZYRTEC) 10 MG tablet Take 10 mg by mouth daily.      . fenofibrate (TRICOR) 48 MG tablet Take 1 tablet (48 mg total) by mouth daily.  30 tablet  6  . Fluticasone-Salmeterol (ADVAIR DISKUS) 500-50 MCG/DOSE AEPB Inhale 1 puff into the lungs 2 (two) times daily.  60 each  11  . guaiFENesin (MUCINEX) 600 MG 12 hr tablet Take 1,200 mg by mouth daily.      . niacin (NIASPAN) 500 MG CR tablet Take 1 tablet (500 mg total) by mouth 3  (three) times daily.  90 tablet  6  . pantoprazole (PROTONIX) 40 MG tablet Take 1 tablet (40 mg total) by mouth daily.  90 tablet  3  . predniSONE (DELTASONE) 20 MG tablet Take 1 tab two times daily x 3 days, 1 daily x 3 days, 1/2 tab daily till gone  12 tablet  0  . psyllium (METAMUCIL SMOOTH TEXTURE) 28 % packet Take 1 packet by mouth daily as needed.          Allergies  Allergen Reactions  . Aspirin     REACTION: causes asthma flare

## 2012-07-04 ENCOUNTER — Other Ambulatory Visit: Payer: Self-pay | Admitting: Pharmacist

## 2012-07-04 DIAGNOSIS — E7800 Pure hypercholesterolemia, unspecified: Secondary | ICD-10-CM

## 2012-08-16 ENCOUNTER — Other Ambulatory Visit (INDEPENDENT_AMBULATORY_CARE_PROVIDER_SITE_OTHER): Payer: Medicare Other

## 2012-08-16 DIAGNOSIS — E7800 Pure hypercholesterolemia, unspecified: Secondary | ICD-10-CM

## 2012-08-16 LAB — LIPID PANEL
Cholesterol: 201 mg/dL — ABNORMAL HIGH (ref 0–200)
Total CHOL/HDL Ratio: 5

## 2012-08-16 LAB — HEPATIC FUNCTION PANEL
ALT: 20 U/L (ref 0–53)
AST: 24 U/L (ref 0–37)
Total Bilirubin: 1.2 mg/dL (ref 0.3–1.2)

## 2012-08-18 LAB — LDL CHOLESTEROL, DIRECT: Direct LDL: 139.6 mg/dL

## 2012-08-22 ENCOUNTER — Ambulatory Visit (INDEPENDENT_AMBULATORY_CARE_PROVIDER_SITE_OTHER): Payer: Medicare Other | Admitting: Pharmacist

## 2012-08-22 DIAGNOSIS — E7800 Pure hypercholesterolemia, unspecified: Secondary | ICD-10-CM

## 2012-08-22 NOTE — Progress Notes (Signed)
Samuel Levine is a 72 y/o male at lipid clinic for follow up of his hypercholesterolemia.  At last visit, his Niaspan was decreased to 1500mg  daily due to increased LFTs.  He has been consistent with taking this except he did stop for the week prior to his labwork to recheck LFTs with Dr. Kriste Basque.  He continues to have tingling and flushing with the 3 tabs of niacin taken at the same  despite pretreatment with ibuprofen (he is allergic to aspirin), however he does not have this reaction when the doses are split 1 tab TID.    His diet typically consists of the following: Breakfast: 2 pieces bacon and 2eggs  (3-4x/week) and a cup of coffee and fruit the other days Lunch: usually skips if he eats breakfast, sometimes [employer]fil[employer] chicken tenders, or 2 handfuls of Fritos or Cheetos Dinner:  PBJ, grilled cheese, fried fish, stewed beef and creamed potatoes  He has cut down on sodas since last visit and is drinking water and lemonade.    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    Current Outpatient Prescriptions  Medication Sig Dispense Refill  . albuterol (VENTOLIN HFA) 108 (90 BASE) MCG/ACT inhaler Inhale 2 puffs into the lungs every 4 (four) hours as needed for wheezing.  1 Inhaler  5  . amLODipine (NORVASC) 5 MG tablet Take 1 tablet (5 mg total) by mouth daily.  90 tablet  3  . cetirizine (ZYRTEC) 10 MG tablet Take 10 mg by mouth daily.      . fenofibrate (TRICOR) 48 MG tablet Take 1 tablet (48 mg total) by mouth daily.  30 tablet  6  . Fluticasone-Salmeterol (ADVAIR DISKUS) 500-50 MCG/DOSE AEPB Inhale 1 puff into the lungs 2 (two) times daily.  60 each  11  . guaiFENesin (MUCINEX) 600 MG 12 hr tablet Take 1,200 mg by mouth daily.      . niacin (NIASPAN) 500 MG CR tablet Take 1 tablet (500 mg total) by mouth 3 (three) times daily.  90 tablet  6  . pantoprazole (PROTONIX) 40 MG tablet Take 1  tablet (40 mg total) by mouth daily.  90 tablet  3  . psyllium (METAMUCIL SMOOTH TEXTURE) 28 % packet Take 1 packet by mouth daily as needed.       Marland Kitchen azithromycin (ZITHROMAX) 250 MG tablet Take as directed  6 each  0  . predniSONE (DELTASONE) 20 MG tablet Take 1 tab two times daily x 3 days, 1 daily x 3 days, 1/2 tab daily till gone  12 tablet  0     Allergies  Allergen Reactions  . Aspirin     REACTION: causes asthma flare

## 2012-08-22 NOTE — Patient Instructions (Addendum)
Your Cholesterol numbers look a little bit better and your liver function is normal. 1.  Continue Niaspan 1 tablet 3 times a day 2.  Continue Tricor (fenofibrate) 1 tablet a day 3.  Drink coffee and a eat a piece of fruit most days a week for breakfast 4.  Decrease bacon or sausage to 2 times a week 5.  Ask for Egg Beaters or egg whites instead of whole egg when out to breakfast 6.  Decrease chips to 1 handful 7.  Add fruit or vegetables to afternoon and evening snack/meal 8.  Try to order baked or broiled foods when out to eat instead of fried. 9.  Try to walk around [place] without stopping at least 1 x a day 10.  Follow up visit 03/21/2013 at 8:30 with Kennon Rounds - get fasting blood work done the week before

## 2012-08-22 NOTE — Assessment & Plan Note (Addendum)
On Follow up visit, labs show LFTs back to WNL.   TC- 207>201 (goal < 200), TG- 94>94 (goal < 150), HDL- 43>38 (goal > 40), LDL- 141> 139 (goal < 100).  He states compliance with medication regimen Tricor 48mg  daily and Niaspan 500mg  TID.  He has intolerance to statins - myalgias.  He is very non-commital to diet changes, although we discussed many low fat alternatives to his current choices.  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  He has a pasture that he walks around and fixes the fence when he finds problems.  He says he is busy.  3.   Drink coffee and a eat a piece of fruit most days a week for breakfast 4.  Decrease bacon or sausage to 2 times a week 5.  Ask for Egg Beaters or egg whites instead of whole egg when out to breakfast 6.  Decrease chips to 1 handful 7.  Add fruit or vegetables to afternoon and evening snack/meal 8.  Try to order baked or broiled foods when out to eat instead of fried. 9.  Try to walk around pasture without stopping at least 1 x a day. 10.  His choice is to follow up in 6months

## 2012-08-24 ENCOUNTER — Other Ambulatory Visit: Payer: Self-pay | Admitting: Pulmonary Disease

## 2012-08-31 ENCOUNTER — Other Ambulatory Visit: Payer: Self-pay | Admitting: Pulmonary Disease

## 2012-08-31 MED ORDER — PANTOPRAZOLE SODIUM 40 MG PO TBEC: 40.0000 mg | DELAYED_RELEASE_TABLET | Freq: Two times a day (BID) | ORAL | Status: DC

## 2012-08-31 NOTE — Telephone Encounter (Signed)
Received form from [facility] and this stated that this pts prescription benefits are not managed by this pharmacy benefit manager.  Called and spoke with the pt and he stated that this rx  Can be sent to his local [facility] for 90 day supply and this has been sent in and nothing further is needed.

## 2012-09-17 ENCOUNTER — Other Ambulatory Visit: Payer: Self-pay

## 2012-12-13 ENCOUNTER — Other Ambulatory Visit: Payer: Self-pay | Admitting: Adult Health

## 2013-03-10 ENCOUNTER — Telehealth: Payer: Self-pay | Admitting: Pulmonary Disease

## 2013-03-10 NOTE — Telephone Encounter (Signed)
rec'd records from [facility], Forward 7 page's to Dr. Kriste Basque

## 2013-03-13 ENCOUNTER — Ambulatory Visit (INDEPENDENT_AMBULATORY_CARE_PROVIDER_SITE_OTHER): Payer: Medicare Other | Admitting: *Deleted

## 2013-03-13 DIAGNOSIS — I059 Rheumatic mitral valve disease, unspecified: Secondary | ICD-10-CM

## 2013-03-13 DIAGNOSIS — E7800 Pure hypercholesterolemia, unspecified: Secondary | ICD-10-CM

## 2013-03-13 DIAGNOSIS — R002 Palpitations: Secondary | ICD-10-CM

## 2013-03-13 LAB — HEPATIC FUNCTION PANEL
ALT: 35 U/L (ref 0–53)
Albumin: 3.9 g/dL (ref 3.5–5.2)
Total Bilirubin: 0.8 mg/dL (ref 0.3–1.2)
Total Protein: 7 g/dL (ref 6.0–8.3)

## 2013-03-13 LAB — LDL CHOLESTEROL, DIRECT: Direct LDL: 186.7 mg/dL

## 2013-03-13 LAB — LIPID PANEL
HDL: 36.1 mg/dL — ABNORMAL LOW (ref >39.00)
Triglycerides: 150 mg/dL — ABNORMAL HIGH (ref 0.0–149.0)

## 2013-03-20 IMAGING — CR DG CHEST 2V
2 series · 2 of 2 positions shown · non-contrast
Comparison: 05/04/2011

CLINICAL DATA: Cough, asthma.

CHEST - 2 VIEW

[view not recorded (1 of 2)]
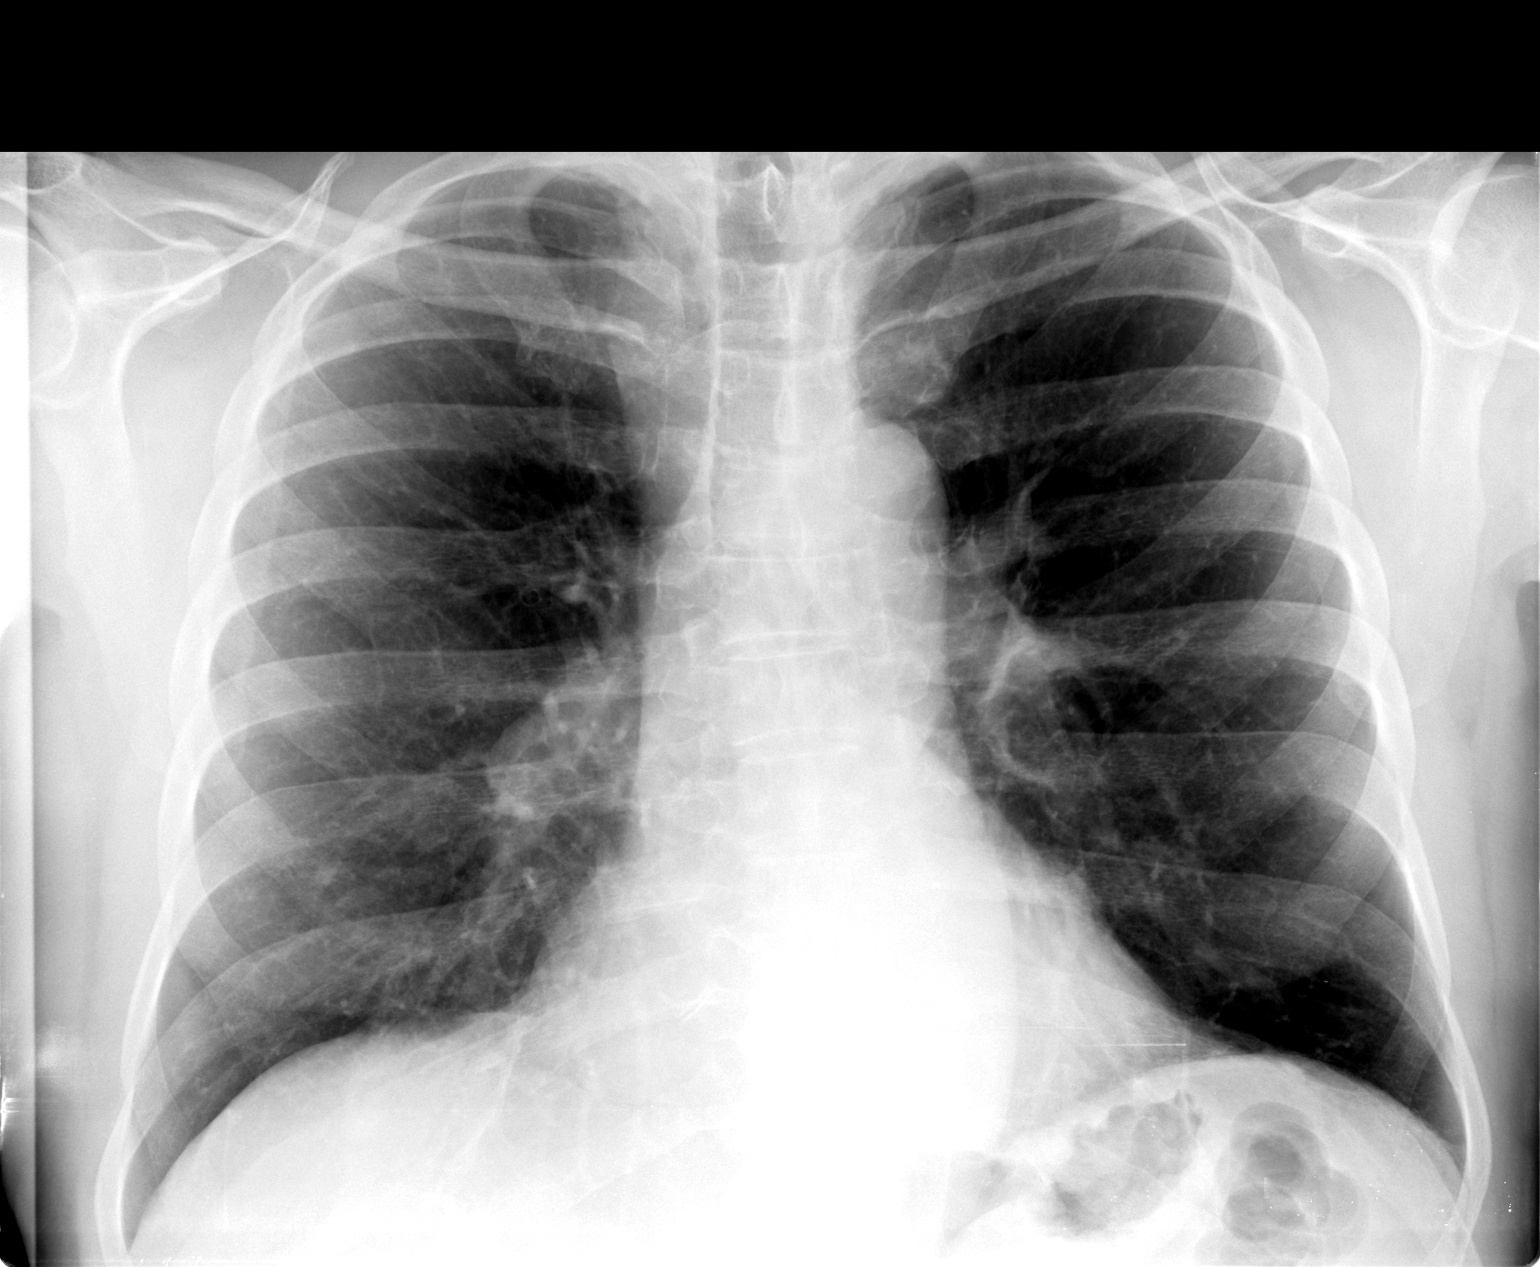

[view not recorded (2 of 2)]
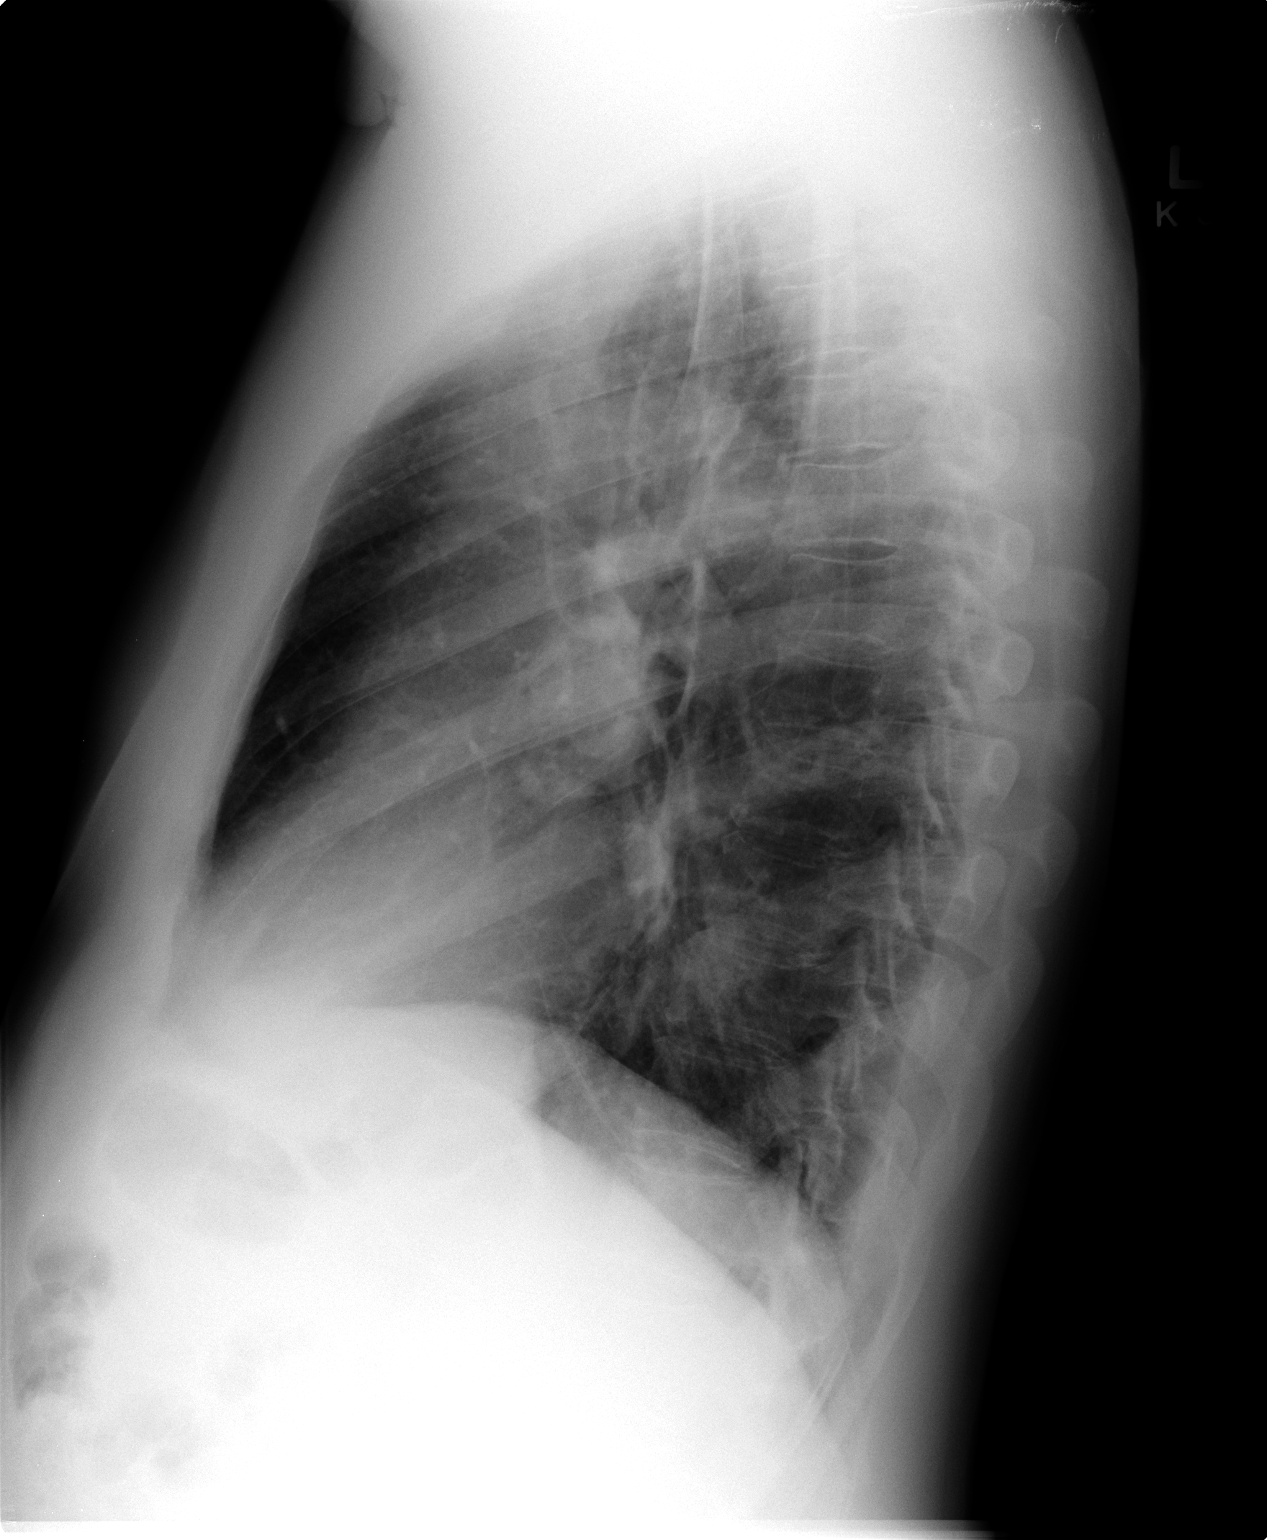

[2 of 2 positions shown; findings below may reference images not displayed]

FINDINGS: Heart and mediastinal contours are within normal limits.
No focal opacities or effusions.  No acute bony abnormality.
IMPRESSION: No active cardiopulmonary disease.

## 2013-03-21 ENCOUNTER — Ambulatory Visit (INDEPENDENT_AMBULATORY_CARE_PROVIDER_SITE_OTHER): Payer: Medicare Other | Admitting: Pharmacist

## 2013-03-21 DIAGNOSIS — E7800 Pure hypercholesterolemia, unspecified: Secondary | ICD-10-CM

## 2013-03-21 MED ORDER — FENOFIBRATE 160 MG PO TABS: 160.0000 mg | ORAL_TABLET | Freq: Every day | ORAL | Status: DC

## 2013-03-21 NOTE — Patient Instructions (Signed)
Start fenofibrate 160mg  once daily  Continue your exercise routine   Recheck labs in 3 months

## 2013-03-21 NOTE — Progress Notes (Signed)
Samuel Levine is a 72 y/o male at lipid clinic for follow up of his hypercholesterolemia. At his last visit, he was instructed to continue Tricor 48mg  daily along with Niaspan 500mg  TID. Since then, however, he was diagnosed with prostate cancer and received hormone injections (last one in May) which he says caused hot flashes. As a result, he self-discontinued all of his medications, including niaspan and tricor. BP meds were stopped d/t dizziness. Intolerance to statins - myalgia   Diet: No significant changes in diet. Today he states eating grilled chicken salads, but along with sweets, shrimp, and fried chicken. He says not much has changed since last visit. 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 He also reports weight loss of 10lbs

## 2013-03-21 NOTE — Assessment & Plan Note (Addendum)
TC 254 (goal <200, up from 201), TG 150 (goal <150, up from 94), HDL 36 (goal > 40, down from 38), LDL 187 (goal <100, up from 139)  ASCVD Risk score is 17.5%  Patient understands that levels have increased due to noncompliance. Understanding his risk for an event, he expresses willingness to restart one medication at this time. Will restart fenofibrate at a higher dose, 160 mg daily. Patient has much room for improvement in terms of diet, but has been doing well with exercise. Follow up labs and visit in 3 months.   Pt seen with Weston Brass, PharmD, CPP

## 2013-05-04 ENCOUNTER — Ambulatory Visit: Payer: Medicare Other | Admitting: Pulmonary Disease

## 2013-05-08 ENCOUNTER — Encounter: Payer: Self-pay | Admitting: Adult Health

## 2013-05-08 ENCOUNTER — Ambulatory Visit (INDEPENDENT_AMBULATORY_CARE_PROVIDER_SITE_OTHER): Payer: Medicare Other | Admitting: Adult Health

## 2013-05-08 VITALS — BP 142/80 | HR 70 | Temp 98.2°F | Ht 70.0 in | Wt 181.8 lb

## 2013-05-08 DIAGNOSIS — J209 Acute bronchitis, unspecified: Secondary | ICD-10-CM

## 2013-05-08 MED ORDER — HYDROCODONE-HOMATROPINE 5-1.5 MG/5ML PO SYRP: 5.0000 mL | ORAL_SOLUTION | Freq: Four times a day (QID) | ORAL | Status: DC | PRN

## 2013-05-08 MED ORDER — PREDNISONE 10 MG PO TABS: ORAL_TABLET | ORAL | Status: DC

## 2013-05-08 MED ORDER — AZITHROMYCIN 250 MG PO TABS: ORAL_TABLET | ORAL | Status: AC

## 2013-05-08 NOTE — Patient Instructions (Addendum)
Zpack take as directed .  Prednisone taper over next week.  Mucinex DM Twice daily  As needed  Cough/congestion  Hydromet 1 tsp every 6 hr As needed  cough, may make you sleepy.  Follow up Dr. Kriste Basque  As planned and As needed   Please contact office for sooner follow up if symptoms do not improve or worsen or seek emergency care

## 2013-05-08 NOTE — Progress Notes (Signed)
Subjective:    Patient ID: Samuel Levine, male    DOB: 1941/03/08, 72 y.o.   MRN: [id number]  HPI 72 y/o WM     05/08/2013 Acute OV  Complains of chest congestion, prod cough with white mucus, head congestion/pressure with white drainage, PND, occ wheezing w/ deep breath, body aches/chills x49month - using Astepro without much help. .  denies hemoptysis, nausea, vomiting, edema. No recent abx use .            Problem List:   ALLERGIC RHINITIS (ICD-477.9) & Hx of SINUSITIS, RECURRENT (ICD-473.9) - he uses OTC antihistamines Prn... he has a long hx of allergies w/ pos testing to dust mostly... he had nasal polyp surg in 1985... he is ASPIRIN sensitive ([facility])... he had additional sinus surgery by DrLawrence in 1997... ~  9/11:  we discussed chr sinus regimen w/ Antihist in AM, Saline nasal pray, & FLONASE Qhs. ~  10/12:  He restarted ASA on his own & has had some lip swelling> pt told to stop ASA immed & ok to Rx w/ Benedryl...  ASTHMA (ICD-493.90) & Hx of ASTHMATIC BRONCHITIS, ACUTE (ICD-466.0) - on ADVAIR 500Bid & VENTOLIN Prn w/ good control... prev on Singulair as well but he stopped this on his own.  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  repeat CXR 9/11 showed clear lungs, norm heart size, no nodule seen... ~  10/12:  CXR shows NAD; radiology queries 8mm RLL nodule & they suggest f/u CT to eval... ~  10/12:  CT Chest showed 5mm RLL nodule w/o ch from 2010; additional 3mm RLL nodule seen, linear scarring R base, no adenopathy... ~  10/13:  CXR showed normal heart size, clear lungs, NAD...  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 he had a negative cath in 1992- no coronary disease, min MVP seen... he has a hx of MVP and occas ectopy w/ incr BP response to exercise... given low  dose Atenolol in past... 2DEcho and Cardiolite in 2005 were both reported normal by DrBerry... ~  3/10:  repeat cardiac eval by [facility] w/ 2DEcho norm x mild AI, & neg Myoview- no ischemia, EF=63%... started AMLODIPINE 5mg /d. ~  EKG 10/13 showed NSR, rate61, rsr' in V1, NAD...  HYPERCHOLESTEROLEMIA (ICD-272.0) - eval and management by DrBerry for [facility]... pt reports being tried on numerous Statins and Zetia & states INTOL to all these meds... in 2008 he stopped Zocor40 due to aching and started Grapeseed extract made by his cousin in [facility] (he reports that this caused bone pain as well)... ~  FLP here 7/08 showed TChol 193, TG 73, HDL 30, LDL 148... rec- incr Simva20 to 40mg /d (Intol & stopped by pt). ~  He reports interim FLPs by [facility]... ~  FLP here 9/11 showed TChol 235, TG 153, HDL 32, LDL 184... needs better diet & exercise program. ~  FLP here 10/12 on diet alone showed TChol 259, TG 155, HDL 37, LDL 196... Reports INTOL to ALL meds, he at least needs a trial in the [facility]id[facility]inic.  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 ~  he had a 24H pH probe in 1991 which was unremarkable... ~  EGD 4/07 by DrPatterson showed 8cmHH, cameron erosions, chr GERD, & gastritis... HPylori was  neg.  DIVERTICULOSIS, COLON (ICD-562.10) & COLONIC POLYPS (ICD-211.3) - he had a lower GI Bleed & was hosp at [facility] in [place] 2/07- required 6u blood, did not require surgery... he takes METAMUCIL daily... ~  colonoscopy 4/07 by DrPatterson showed divertics, 6mm polyp in asc colon= adenomatous w/ f/u planned for 68yrs... ~  CT Abd/ Pelvis Feb10= NEG w/ mult incidental findings- right base atelec, 6mm RLL nodule, right adrenal nodule, cysts in kidneys/ liver, DDD in lumbar sp... ~  colonoscopy 4/10 by DrPatterson showed divertics & nodule in sigmoid= tubular adenoma on bx; f/u planned 60yrs.  NEPHROLITHIASIS >> first & only kidney stone 5/12 ~45mm & he passed it, no other stones  seen... Hx of PROSTATE CANCER (ICD-185) - found to have right postate nodule on DRE 1/08 during his last CPX... PSA= 4.87... referred to Urology w/ pos biopies and robotic prostatectomy 4/08 by DrGrapey- he had capsular extension but neg margins etc... subseq PSA's were nondetectable, but then started to rise again and was 0.22 in Jul09- therefore sent for adjuvant XRT which he tolerated well w/ decr in the PSA... pt still followed by DrGrapey every 4-6 months due to continued slow rise in PSA post XRT... ~  5/12:  Seen by DrGrapey for f/u of his recurrent prostate cancer (every 71mo) w/ slow rise in his PSA up to 1.47 in [id number], doubling time ~6-59months, per DrGrapey the plan is to continue surveillance & intervene w/ hormone therapy when PSA approaches 10...  DEGENERATIVE DISC DISEASE (ICD-722.6) - on VOLTAREN 75mg Bid Prn (he apparently tolerates NSAIDs OK despite his hx [facility] & ASA sensitivity)...  NECK PAIN (ICD-723.1) & SHOULDER PAIN (ICD-719.41) ~  XRays Feb10 from ER showed  degen changes, bilat foraminal stenoses, facet arthropathy, anterolisthesis C5 & C6.  Hx of BACK PAIN, LUMBAR (ICD-724.2) - he had 2 lumbar laminectomies in the past by DrDeaton...   ANXIETY (ICD-300.00)   Past Surgical History  Procedure Laterality Date  . Tonsillectomy    . Lumbar laminectomy    . Achilles tendon repair    . Prostatectomy      Outpatient Encounter Prescriptions as of 05/08/2013  Medication Sig Dispense Refill  . albuterol (VENTOLIN HFA) 108 (90 BASE) MCG/ACT inhaler Inhale 2 puffs into the lungs every 4 (four) hours as needed for wheezing.  1 Inhaler  5  . cetirizine (ZYRTEC) 10 MG tablet Take 10 mg by mouth daily as needed.       . Fluticasone-Salmeterol (ADVAIR DISKUS) 500-50 MCG/DOSE AEPB Inhale 1 puff into the lungs 2 (two) times daily.  60 each  11  . [DISCONTINUED] fenofibrate 160 MG tablet Take 1 tablet (160 mg total) by mouth daily.  30 tablet  6   No facility-administered  encounter medications on file as of 05/08/2013.    Allergies  Allergen Reactions  . Aspirin     REACTION: causes asthma flare    Current Medications, Allergies, Past Medical History, Past Surgical History, Family History, and Social History were reviewed in [employer] record.    Review of Systems Constitutional:   No  weight loss, night sweats,  Fevers, chills, fatigue, or  lassitude.  HEENT:   No headaches,  Difficulty swallowing,  Tooth/dental problems, or  Sore throat,                No sneezing, itching, ear ache,  +nasal congestion, post nasal drip,   CV:  No chest pain,  Orthopnea, PND, swelling in lower extremities, anasarca,  dizziness, palpitations, syncope.   GI  No heartburn, indigestion, abdominal pain, nausea, vomiting, diarrhea, change in bowel habits, loss of appetite, bloody stools.   Resp:    No chest wall deformity  Skin: no rash or lesions.  GU: no dysuria, change in color of urine, no urgency or frequency.  No flank pain, no hematuria   MS:  No joint pain or swelling.  No decreased range of motion.  No back pain.  Psych:  No change in mood or affect. No depression or anxiety.  No memory loss.             Objective:   Physical Exam     WD, WN, 72 y/o WM in NAD...  GENERAL:  Alert & oriented; pleasant & cooperative... HEENT:  [place]/AT, EOM-wnl, PERRLA, EACs-clear, TMs-wnl, NOSE-congested, THROAT-clear & wnl. NECK:  Supple w/ decrROM; no JVD; normal carotid impulses w/o bruits; no thyromegaly or nodules palpated; no lymphadenopathy. CHEST:  Clear to P & A;  min scat rhonchi without wheezing/ rales/ consolidation... HEART:  Regular Rhythm; without murmurs/ rubs/ or gallops detected... ABDOMEN:  Soft & nontender; normal bowel sounds; no organomegaly or masses palpated... EXT: without deformities, mod arthritic changes; no varicose veins/ venous insuffic/ or edema. NEURO:  no focal neuro deficits... DERM:  No lesions noted; no rash  etc...  RADIOLOGY DATA:  Reviewed in the EPIC EMR & discussed w/ the patient...  LABORATORY DATA:  Reviewed in the EPIC EMR & discussed w/ the patient...   Assessment & Plan:

## 2013-05-11 NOTE — Assessment & Plan Note (Signed)
Zpack take as directed .  Prednisone taper over next week.  Mucinex DM Twice daily  As needed  Cough/congestion  Hydromet 1 tsp every 6 hr As needed  cough, may make you sleepy.  Follow up Dr. Nadel  As planned and As needed   Please contact office for sooner follow up if symptoms do not improve or worsen or seek emergency care   

## 2013-05-25 ENCOUNTER — Ambulatory Visit (INDEPENDENT_AMBULATORY_CARE_PROVIDER_SITE_OTHER): Payer: Medicare Other | Admitting: Pulmonary Disease

## 2013-05-25 ENCOUNTER — Encounter: Payer: Self-pay | Admitting: Pulmonary Disease

## 2013-05-25 ENCOUNTER — Other Ambulatory Visit (INDEPENDENT_AMBULATORY_CARE_PROVIDER_SITE_OTHER): Payer: Medicare Other

## 2013-05-25 VITALS — BP 116/80 | HR 68 | Temp 97.6°F | Ht 70.0 in | Wt 180.2 lb

## 2013-05-25 DIAGNOSIS — IMO0002 Reserved for concepts with insufficient information to code with codable children: Secondary | ICD-10-CM

## 2013-05-25 DIAGNOSIS — K5730 Diverticulosis of large intestine without perforation or abscess without bleeding: Secondary | ICD-10-CM

## 2013-05-25 DIAGNOSIS — F419 Anxiety disorder, unspecified: Secondary | ICD-10-CM

## 2013-05-25 DIAGNOSIS — Z86010 Personal history of colonic polyps: Secondary | ICD-10-CM

## 2013-05-25 DIAGNOSIS — F411 Generalized anxiety disorder: Secondary | ICD-10-CM

## 2013-05-25 DIAGNOSIS — Z23 Encounter for immunization: Secondary | ICD-10-CM

## 2013-05-25 DIAGNOSIS — E7800 Pure hypercholesterolemia, unspecified: Secondary | ICD-10-CM

## 2013-05-25 DIAGNOSIS — K449 Diaphragmatic hernia without obstruction or gangrene: Secondary | ICD-10-CM

## 2013-05-25 DIAGNOSIS — K219 Gastro-esophageal reflux disease without esophagitis: Secondary | ICD-10-CM

## 2013-05-25 DIAGNOSIS — J45909 Unspecified asthma, uncomplicated: Secondary | ICD-10-CM

## 2013-05-25 DIAGNOSIS — C61 Malignant neoplasm of prostate: Secondary | ICD-10-CM

## 2013-05-25 DIAGNOSIS — I059 Rheumatic mitral valve disease, unspecified: Secondary | ICD-10-CM

## 2013-05-25 DIAGNOSIS — N200 Calculus of kidney: Secondary | ICD-10-CM

## 2013-05-25 LAB — HEPATIC FUNCTION PANEL
ALT: 24 U/L (ref 0–53)
AST: 22 U/L (ref 0–37)
Albumin: 3.7 g/dL (ref 3.5–5.2)
Alkaline Phosphatase: 71 U/L (ref 39–117)
Total Protein: 6.6 g/dL (ref 6.0–8.3)

## 2013-05-25 LAB — CBC WITH DIFFERENTIAL/PLATELET
Basophils Relative: 0.6 % (ref 0.0–3.0)
Eosinophils Absolute: 0.3 10*3/uL (ref 0.0–0.7)
Eosinophils Relative: 5.1 % — ABNORMAL HIGH (ref 0.0–5.0)
Lymphocytes Relative: 20.6 % (ref 12.0–46.0)
MCHC: 34.7 g/dL (ref 30.0–36.0)
Neutrophils Relative %: 63 % (ref 43.0–77.0)
Platelets: 173 10*3/uL (ref 150.0–400.0)
RBC: 4.19 Mil/uL — ABNORMAL LOW (ref 4.22–5.81)
WBC: 5.3 10*3/uL (ref 4.5–10.5)

## 2013-05-25 LAB — TSH: TSH: 0.95 u[IU]/mL (ref 0.35–5.50)

## 2013-05-25 LAB — LDL CHOLESTEROL, DIRECT: Direct LDL: 183.7 mg/dL

## 2013-05-25 LAB — BASIC METABOLIC PANEL
Calcium: 9.4 mg/dL (ref 8.4–10.5)
Creatinine, Ser: 0.9 mg/dL (ref 0.4–1.5)

## 2013-05-25 LAB — LIPID PANEL: Cholesterol: 247 mg/dL — ABNORMAL HIGH (ref 0–200)

## 2013-05-25 NOTE — Progress Notes (Signed)
Subjective:    Patient ID: Samuel Levine, male    DOB: June 25, 1941, 72 y.o.   MRN: [id number]  HPI 72 y/o WM here for a follow up visit... he has multiple medical problems as noted below...    ~  April 17, 2010:  58mo ROV- he saw DrBerry 3/10 w/ 2DEcho (normal, x mild AI) & Myoview (neg- no ischemia, EF= 63%)... he had a colonoscopy by DrPatterson 4/10- mild divertics, sm nodule in sigmid, bx= tubular adenoma & f/u planned 79yrs...  he saw DrGrapey for Urology 8/11 for f/u prostate cancer s/p prostatectomy & then had adjuvant XRT for rising PSA post surg & is followed every 4-52mo w/ continued slow rise in PSA to 0.8- 8/11(watchful waiting protocol before considering hormonal therapy)...  he feels his Asthma is under good control;  CXR today for f/u 6mm nodule-  no change;  BP undr good conrol, denies CP, etc;  Chol is poorly controlled on diet alone- but he is intol to all statins etc;  otherw stable w/o new complaints or concerns he says.  ~  May 04, 2011:  Yearly ROV & Lavarr reports several problems this yr>  He had 1st ever kidney stone event (2mm stone passed spont per DrGrapey); he continues f/u of his recurrent prostate cancer every 56mo from Urology w/ slow rise in his PSA up to 1.47 in May, doubling time ~6-40months, per DrGrapey the plan is to continue surveillance & intervene w/ hormone therapy when PSA approaches 10...  Angelica also had some incr allergy manifestations w/ several episodes of lip tingling & swelling- he treated self w/ Benedryl w/ resolution; pt not on ACE med; on careful questioning it seems he restarted ASA 81mg  despite his hx ASA sensitivity & Triad Asthma (thankfully no severe reaction)> I told him the ASA was prob the culprit & to stop this med immediately, use Tylenol for pain, etc...  Due for CXR (NAD, ? nodule- check CT for completeness) & Fasting labs today (FLP looks terrible & [facility] has him on diet alone, intol to all statins), ok Flu shot, he wants Depo as  well...  ~  May 03, 2012:  Yearly ROV & May has had a good year> He notes recent URI w/ congestion, drainage, cough & sm amt thick beige sput; he is taking Zyrtek, Advair500, & Ventolin HFA as needed, so we decided to RX w/ ZPak, Depo80, Pred- 3d taper, & Mucinex...  He has been followed by the [facility] for his Chol & started on NIASPAN- grad incr to 3-4 daily w/ improvement in his FLP but they noted incr LFTs when the dose went to 4/d; he has since backed off to 3/d & is to f/u in Lipid clinic soon; we rechecked his routine labs today and note his LFTs are improved w/ decr in the Niaspan dose as below...    AR/ AB> as above, he is on Zyrtek10, Advair500, & Ventolin HFA; URI as above...    Pulm Nodules> see prev CTChest 10/12- 5mm RLL nodule w/o ch from 2010; additional 3mm RLL nodule seen, linear scarring R base, no adenopathy...    MVP/ mild AI> on Norvasc5; BP= 132/96 & he is reminded to elim sodium, & get wt down...    Chol> back on Niaspan 3/d; FLP 8/13 on 4/d showed TChol 157, TG 64, HDL 38, LDL 104 but LFTs are up Sgot=55 Sgpt=116 & therefore decr back to 3/d...    HH/ GERD> on Protonix40; he denies abd pain, n/v, dysphagia,  etc...    Divertics/ Polyps> on Metamucil daily; doing satis w regular BMs, no d/c/ blood...    Kidney stones/ PROSTATE CANCER> seen by DrGrapey 5/13- PSA up to 5.9 w/ doubling time of 92mo; they plan restaging & Rx w/ hormone therapy when PSA~10.    DDD/ Neck pain/ LBP> on OTC meds as needed...    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 We reviewed prob list, meds, xrays and labs> see below for updates >> Hold on Flu shot til after the acute AB rx... CXR 10/13 showed normal heart size, clear lungs, no nodules seen... EKG 10/13 showed NSR, rate61, rsr' in V1, otherw wnl... LABS 10/13:  Chems- wnl;  CBC- wnl;  TSH=1.68...   ~  May 25, 2013:  Yearly ROV & Aydien reports that his PSA hit 10 this past yr, then 53 23mo later &  DrGrapey did further eval w/ neg bone scan & started pt on Lupron shots Q93mo w/ improved PSA since then; This caused hot flashes and for some reason Hannan decided to stop all of his meds- Amlodipine5, Niacin, Fenofibrate, Protonix;  He had a bout of acute bronchitis 10/14 & saw TP- treated w/ ZPak, Pred, Hydromet & improved;  We reviewed the following medical problems during today's office visit >>     AR/ AB> on Zyrtek10, Advair500, Ventolin HFA & Mucinex; breathing is back to baseline after the bronchitic exac; rec to continue these meds regularly...    Pulm Nodules> see prev CTChest 10/12- 5mm RLL nodule w/o ch from 2010; additional 3mm RLL nodule seen, linear scarring R base, no adenopathy; last CXR 10/13 was clear, wnl, no lesions seen & he remains asymptomatic...    MVP/ mild AI> off Norvasc; BP= 116/80 & he is reminded to elim sodium, & get wt down; denies CP, palpit, SOB, edema, etc...    Chol> off prev Niacin & Feno- he stopped due to hot flashes from his prostate cancer meds; Intol to all statins; FLP 10/14 on diet- TChol 247, TG 212, HDL 42, LDL 184; LC tried to restart his Feno160 but he declined; offered Zetia10 & he will decide...    HH/ GERD> prev on Protonix40 (now just prn); he denies abd pain, n/v, dysphagia, etc...    Divertics/ Polyps> on Metamucil daily; doing satis w regular BMs, no d/c/ blood; last colonoscopy by DrPatterson was 4/10 w/ divertics & one adenoma removed, f/u 68yrs...    Kidney stones/ PROSTATE CANCER> followed by DrGrapey & s/p surg 2008, then XRT 2009; known biochem recurrence w/ PSA up to 21 [id number] & started on Lupron- improved & PSA back to 0.03...    DDD/ Neck pain/ LBP> on OTC meds as needed...    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 We reviewed prob list, meds, xrays and labs> see below for updates >> OK 2014 Flu vaccine today... LABS 1014:  FLP- way off on diet alone w/ LDL=184;  Chems- wnl;  CBC- wnl;   TSH=0.95...           Problem List:   ALLERGIC RHINITIS (ICD-477.9) & Hx of SINUSITIS, RECURRENT (ICD-473.9) - he uses OTC antihistamines Prn... he has a long hx of allergies w/ pos testing to dust mostly... he had nasal polyp surg in 1985... he is ASPIRIN sensitive (Triad Asthma)... he had additional sinus surgery by DrLawrence in 1997... ~  9/11:  we discussed chr sinus regimen w/ Antihist in  AM, Saline nasal pray, & FLONASE Qhs. ~  10/12:  He restarted ASA on his own & has had some lip swelling> pt told to stop ASA immed & ok to Rx w/ Benedryl...  ASTHMA (ICD-493.90) & Hx of ASTHMATIC BRONCHITIS, ACUTE (ICD-466.0) - on ADVAIR 500Bid & VENTOLIN Prn w/ good control... prev on Singulair as well but he stopped this on his own. ~  10/14: he had URI/ bronchitic exac treated by TP w/ ZPak, Pred, Hydromet & improved; encouraged to use the Advair & Mucinex regularly...  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  repeat CXR 9/11 showed clear lungs, norm heart size, no nodule seen... ~  10/12:  CXR shows NAD; radiology queries 8mm RLL nodule & they suggest f/u CT to eval... ~  10/12:  CT Chest showed 5mm RLL nodule w/o ch from 2010; additional 3mm RLL nodule seen, linear scarring R base, no adenopathy... ~  10/13:  CXR showed normal heart size, clear lungs, NAD...  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 he had a negative cath in 1992- no coronary disease, min MVP seen... he has a hx of MVP and occas ectopy w/ incr BP response to exercise... given low dose Atenolol in past... 2DEcho and Cardiolite in 2005 were both reported normal by DrBerry... ~  3/10:  repeat cardiac eval by [facility] w/ 2DEcho norm x mild AI, & neg Myoview- no ischemia, EF=63%... started AMLODIPINE 5mg /d. ~  EKG 10/13 showed NSR,  rate61, rsr' in V1, NAD...  HYPERCHOLESTEROLEMIA (ICD-272.0) - eval and management by DrBerry for [facility]... pt reports being tried on numerous Statins and Zetia & states INTOL to all these meds... in 2008 he stopped Zocor40 due to aching and started Grapeseed extract made by his cousin in [facility] (he reports that this caused bone pain as well)... ~  FLP here 7/08 showed TChol 193, TG 73, HDL 30, LDL 148... rec- incr Simva20 to 40mg /d (Intol & stopped by pt). ~  He reports interim FLPs by [facility]... ~  FLP here 9/11 showed TChol 235, TG 153, HDL 32, LDL 184... needs better diet & exercise program. ~  FLP here 10/12 on diet alone showed TChol 259, TG 155, HDL 37, LDL 196... Reports INTOL to ALL meds, he at least needs a trial in the [facility]id[facility]inic. ~  Followed in the Lipid clinic> Intol to all statins; they have tried Niacin, Fenofib, etc w/ some improvement but he stopped all meds when he started Lupron for prostate cancer due to the hot flashes...  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 ~  he had a 24H pH probe in 1991 which was unremarkable... ~  EGD 4/07 by DrPatterson showed 8cmHH, cameron erosions, chr GERD, & gastritis... HPylori was neg.  DIVERTICULOSIS, COLON (ICD-562.10) & COLONIC POLYPS (ICD-211.3) - he had a lower GI Bleed & was hosp at [facility] in [place] 2/07- required 6u blood, did not require surgery... he takes METAMUCIL daily... ~  colonoscopy 4/07 by DrPatterson showed divertics, 6mm polyp in asc colon= adenomatous w/ f/u planned for 45yrs... ~  CT Abd/ Pelvis Feb10= NEG w/ mult incidental findings- right base atelec, 6mm RLL nodule, right adrenal nodule, cysts in kidneys/ liver, DDD in lumbar sp... ~  colonoscopy 4/10 by DrPatterson showed  divertics & nodule in sigmoid= tubular adenoma on bx; f/u planned 10yrs.  NEPHROLITHIASIS >> first & only kidney stone 5/12 ~47mm & he passed it, no other stones seen... Hx of PROSTATE CANCER (ICD-185) -  found to have right postate nodule on DRE 1/08 during his last CPX... PSA= 4.87... referred to Urology w/ pos biopies and robotic prostatectomy 4/08 by DrGrapey- he had capsular extension but neg margins etc... subseq PSA's were nondetectable, but then started to rise again and was 0.22 in Jul09- therefore sent for adjuvant XRT which he tolerated well w/ decr in the PSA... pt still followed by DrGrapey every 4-6 months due to continued slow rise in PSA post XRT... ~  5/12:  Seen by DrGrapey for f/u of his recurrent prostate cancer (every 27mo) w/ slow rise in his PSA up to 1.47 in [id number], doubling time ~6-68months, per DrGrapey the plan is to continue surveillance & intervene w/ hormone therapy when PSA approaches 10... ~  10/14:  Saw DrGrapey for routine 27mo ROV & Lupron injection> s/p surg 4/08, then XRT finished 9/09; biochemical recurrence w/ neg bone scan & started Lupron when PSA=21 [id number]; subseq Testos level checked & low, repeat PSA=0.03, doing well...  DEGENERATIVE DISC DISEASE (ICD-722.6) - on VOLTAREN 75mg Bid Prn (he apparently tolerates NSAIDs OK despite his hx Triad Asthma & ASA sensitivity)...  NECK PAIN (ICD-723.1) & SHOULDER PAIN (ICD-719.41) ~  XRays Feb10 from ER showed  degen changes, bilat foraminal stenoses, facet arthropathy, anterolisthesis C5 & C6.  Hx of BACK PAIN, LUMBAR (ICD-724.2) - he had 2 lumbar laminectomies in the past by DrDeaton...   ANXIETY (ICD-300.00)   Past Surgical History  Procedure Laterality Date  . Tonsillectomy    . Lumbar laminectomy    . Achilles tendon repair    . Prostatectomy      Outpatient Encounter Prescriptions as of 05/25/2013  Medication Sig Dispense Refill  . albuterol (VENTOLIN HFA) 108 (90 BASE) MCG/ACT inhaler Inhale 2 puffs into the lungs every 4 (four) hours as needed for wheezing.  1 Inhaler  5  . cetirizine (ZYRTEC) 10 MG tablet Take 10 mg by mouth daily as needed.       . Fluticasone-Salmeterol (ADVAIR DISKUS) 500-50  MCG/DOSE AEPB Inhale 1 puff into the lungs 2 (two) times daily.  60 each  11  . [DISCONTINUED] HYDROcodone-homatropine (HYDROMET) 5-1.5 MG/5ML syrup Take 5 mLs by mouth every 6 (six) hours as needed for cough.  240 mL  0  . [DISCONTINUED] predniSONE (DELTASONE) 10 MG tablet 4 tabs for 2 days, then 3 tabs for 2 days, 2 tabs for 2 days, then 1 tab for 2 days, then stop  20 tablet  0   No facility-administered encounter medications on file as of 05/25/2013.    Allergies  Allergen Reactions  . Aspirin     REACTION: causes asthma flare    Current Medications, Allergies, Past Medical History, Past Surgical History, Family History, and Social History were reviewed in [employer] record.    Review of Systems         The patient complains of nasal congestion, constipation, gas/bloating, stiffness, arthritis, and hay fever.  The patient denies fever, chills, sweats, anorexia, fatigue, weakness, malaise, weight loss, sleep disorder, blurring, diplopia, eye irritation, eye discharge, vision loss, eye pain, photophobia, earache, ear discharge, tinnitus, decreased hearing, nosebleeds, sore throat, hoarseness, chest pain, palpitations, syncope, dyspnea on exertion, orthopnea, PND, peripheral edema, cough, dyspnea at rest, excessive sputum, hemoptysis, wheezing, pleurisy, nausea, vomiting,  diarrhea, change in bowel habits, abdominal pain, melena, hematochezia, jaundice, indigestion/heartburn, dysphagia, odynophagia, dysuria, hematuria, urinary frequency, urinary hesitancy, nocturia, incontinence, back pain, joint pain, joint swelling, muscle cramps, muscle weakness, sciatica, restless legs, leg pain at night, leg pain with exertion, rash, itching, dryness, suspicious lesions, paralysis, paresthesias, seizures, tremors, vertigo, transient blindness, frequent falls, frequent headaches, difficulty walking, depression, anxiety, memory loss, confusion, cold intolerance, heat intolerance,  polydipsia, polyphagia, polyuria, unusual weight change, abnormal bruising, bleeding, enlarged lymph nodes, urticaria, allergic rash, and recurrent infections.     Objective:   Physical Exam     WD, WN, 72 y/o WM in NAD...  GENERAL:  Alert & oriented; pleasant & cooperative... HEENT:  [place]/AT, EOM-wnl, PERRLA, EACs-clear, TMs-wnl, NOSE-congested, THROAT-clear & wnl. NECK:  Supple w/ decrROM; no JVD; normal carotid impulses w/o bruits; no thyromegaly or nodules palpated; no lymphadenopathy. CHEST:  Clear to P & A;  min scat rhonchi without wheezing/ rales/ consolidation... HEART:  Regular Rhythm; without murmurs/ rubs/ or gallops detected... ABDOMEN:  Soft & nontender; normal bowel sounds; no organomegaly or masses palpated... EXT: without deformities, mod arthritic changes; no varicose veins/ venous insuffic/ or edema. BACK:  +tender spots/ trigger points... decr ROM spine... NEURO:  CN's intact;  no focal neuro deficits... DERM:  No lesions noted; no rash etc...  RADIOLOGY DATA:  Reviewed in the EPIC EMR & discussed w/ the patient...  LABORATORY DATA:  Reviewed in the EPIC EMR & discussed w/ the patient...   Assessment & Plan:    AR & Asthma>  Encouraged to continue Advair regularly + Ventolin rescue; use MUCINEX, Benedryl as needed...  PULM NODULE>  Hx tiny 6mm periph RLL nodule seen on CT 2/10 (done in ER, incidental finding) & not prev seen on CXR; radiologist queries 8mm nodule on 10/12 film & suggests f/u CT ==> no change in 5mm RLL nodule & ?new65mm RLL lesion;  CXR 10/13 w/o nodules seen...  CARDS> HxCP, mildMVP, mildAI>  [facility], DrBerry (?when last visit); prev on Amlodipine5mg  (he stopped on his own)- denies CP, palpit, dizzy, SOB, etc; he will call for ROV...  CHOL>  He has been INTOL to all statins & he tells me [facility] said just to stay on diet despite his poor numbers; referred to [facility] w/ some improvement on Niacin/ Fenofib but he stopped all meds when he developed hot  flashes on Lupron; FLP 10/14 w/ LDL=184 & rec to try Zetia10- he will decide...  LARGE HH/ GERD>  8cmHH on EGD by DrPatterson 2007, pt prev on Protonix Bid; he refused to continue & using Protonix prn...  Divertics/ Colon Polyps>  Hx lower GIB in 2007, he uses Metamucil daily, last colon 4/10 w/ adenoma & f/u planned 37yrs...  Prostate Ca/ KidStone>  Followed by DrGrapey, s/p surg & XRT, now on Lupron for biochem recurrence...  DJD, Neck pain, Shoulder pain, LBP>  On Voltaren prn...  Anxiety>  He copes very well & does not require anxiolytic RX...   Patient's Medications  New Prescriptions   No medications on file  Previous Medications   ALBUTEROL (VENTOLIN HFA) 108 (90 BASE) MCG/ACT INHALER    Inhale 2 puffs into the lungs every 4 (four) hours as needed for wheezing.   CETIRIZINE (ZYRTEC) 10 MG TABLET    Take 10 mg by mouth daily as needed.    FLUTICASONE-SALMETEROL (ADVAIR DISKUS) 500-50 MCG/DOSE AEPB    Inhale 1 puff into the lungs 2 (two) times daily.  Modified Medications   No medications  on file  Discontinued Medications   HYDROCODONE-HOMATROPINE (HYDROMET) 5-1.5 MG/5ML SYRUP    Take 5 mLs by mouth every 6 (six) hours as needed for cough.   PREDNISONE (DELTASONE) 10 MG TABLET    4 tabs for 2 days, then 3 tabs for 2 days, 2 tabs for 2 days, then 1 tab for 2 days, then stop

## 2013-05-25 NOTE — Patient Instructions (Signed)
Today we updated your med list in our EPIC system...   Try to restart your FENOFIBRATE 160mg  per day & keep your planned f/u w/ the Lipid clinic...  Today we did your follow up blood work...    We will contact you w/ the results when available...   Keep up the good work w/ your diet & exercise...  Call for any questions...  Let's plan a follow up visit in 33yr, sooner if needed for problems.Marland KitchenMarland Kitchen

## 2013-05-26 ENCOUNTER — Other Ambulatory Visit: Payer: Self-pay | Admitting: Pulmonary Disease

## 2013-05-26 MED ORDER — EZETIMIBE 10 MG PO TABS: 10.0000 mg | ORAL_TABLET | Freq: Every day | ORAL | Status: AC

## 2013-05-26 NOTE — Telephone Encounter (Signed)
Opened refill request in error.

## 2013-06-15 ENCOUNTER — Other Ambulatory Visit (INDEPENDENT_AMBULATORY_CARE_PROVIDER_SITE_OTHER): Payer: Medicare Other

## 2013-06-15 DIAGNOSIS — E7800 Pure hypercholesterolemia, unspecified: Secondary | ICD-10-CM

## 2013-06-15 LAB — LIPID PANEL
Cholesterol: 156 mg/dL (ref 0–200)
VLDL: 11 mg/dL (ref 0.0–40.0)

## 2013-06-15 LAB — HEPATIC FUNCTION PANEL
ALT: 26 U/L (ref 0–53)
AST: 32 U/L (ref 0–37)
Alkaline Phosphatase: 59 U/L (ref 39–117)
Total Bilirubin: 0.9 mg/dL (ref 0.3–1.2)

## 2013-06-22 ENCOUNTER — Ambulatory Visit: Payer: Medicare Other | Admitting: Pharmacist

## 2013-08-17 ENCOUNTER — Other Ambulatory Visit: Payer: Self-pay

## 2013-08-17 MED ORDER — FLUTICASONE-SALMETEROL 500-50 MCG/DOSE IN AEPB: 1.0000 | INHALATION_SPRAY | Freq: Two times a day (BID) | RESPIRATORY_TRACT | Status: AC

## 2013-08-17 NOTE — Telephone Encounter (Signed)
DLV 10.23.14//ROV 10.23.15 Advair 500/50 refilled, nothing further needed.

## 2013-11-27 ENCOUNTER — Encounter: Payer: Self-pay | Admitting: Internal Medicine

## 2013-12-12 ENCOUNTER — Other Ambulatory Visit: Payer: Medicare Other

## 2013-12-14 ENCOUNTER — Ambulatory Visit: Payer: Medicare Other | Admitting: Pharmacist

## 2014-04-23 ENCOUNTER — Encounter: Payer: Self-pay | Admitting: Internal Medicine

## 2014-05-08 ENCOUNTER — Encounter: Payer: Self-pay | Admitting: Gastroenterology

## 2014-05-25 ENCOUNTER — Ambulatory Visit: Payer: Medicare Other | Admitting: Pulmonary Disease

## 2019-10-30 ENCOUNTER — Telehealth: Payer: Self-pay

## 2019-10-30 NOTE — Telephone Encounter (Signed)
New Message  Spoke with Amy from [facility] primary care that was following up on a referral that was faxed in on 03/26. Informed that it is not in the system and will need to be faxed in again to [phone].

## 2019-11-02 ENCOUNTER — Telehealth: Payer: Self-pay | Admitting: General Practice

## 2019-11-02 NOTE — Telephone Encounter (Signed)
New Message    PCP was calling to see if referral was received

## 2019-11-03 ENCOUNTER — Telehealth: Payer: Self-pay

## 2019-11-03 NOTE — Telephone Encounter (Signed)
NOTES ON FILE FROM [facility] [phone], SENT REFERRAL TO SCHEDULING 

## 2019-11-14 ENCOUNTER — Ambulatory Visit: Payer: Self-pay | Admitting: Cardiology
